# Patient Record
Sex: Female | Born: 1965
Health system: Southern US, Community
[De-identification: ages and names within clinical notes are randomized; demographics above are authoritative.]

## PROBLEM LIST (undated history)

## (undated) DIAGNOSIS — I10 Essential (primary) hypertension: Secondary | ICD-10-CM

## (undated) HISTORY — DX: Essential (primary) hypertension: I10

## (undated) HISTORY — PX: CHOLECYSTECTOMY: SHX55

---

## 1999-09-04 ENCOUNTER — Emergency Department (HOSPITAL_COMMUNITY): Admission: EM | Admit: 1999-09-04 | Discharge: 1999-09-04 | Payer: Self-pay | Admitting: Emergency Medicine

## 2000-07-12 ENCOUNTER — Other Ambulatory Visit: Admission: RE | Admit: 2000-07-12 | Discharge: 2000-07-12 | Payer: Self-pay | Admitting: Family Medicine

## 2001-10-11 ENCOUNTER — Other Ambulatory Visit: Admission: RE | Admit: 2001-10-11 | Discharge: 2001-10-11 | Payer: Self-pay | Admitting: Family Medicine

## 2003-04-09 ENCOUNTER — Other Ambulatory Visit: Admission: RE | Admit: 2003-04-09 | Discharge: 2003-04-09 | Payer: Self-pay | Admitting: Family Medicine

## 2004-05-23 ENCOUNTER — Other Ambulatory Visit: Admission: RE | Admit: 2004-05-23 | Discharge: 2004-05-23 | Payer: Self-pay | Admitting: Family Medicine

## 2005-06-28 ENCOUNTER — Other Ambulatory Visit: Admission: RE | Admit: 2005-06-28 | Discharge: 2005-06-28 | Payer: Self-pay | Admitting: Family Medicine

## 2006-01-24 ENCOUNTER — Emergency Department (HOSPITAL_COMMUNITY): Admission: EM | Admit: 2006-01-24 | Discharge: 2006-01-24 | Payer: Self-pay | Admitting: Emergency Medicine

## 2006-07-02 ENCOUNTER — Other Ambulatory Visit: Admission: RE | Admit: 2006-07-02 | Discharge: 2006-07-02 | Payer: Self-pay | Admitting: Family Medicine

## 2006-07-31 ENCOUNTER — Ambulatory Visit (HOSPITAL_COMMUNITY): Admission: RE | Admit: 2006-07-31 | Discharge: 2006-07-31 | Payer: Self-pay | Admitting: Obstetrics and Gynecology

## 2013-03-13 ENCOUNTER — Other Ambulatory Visit: Payer: Self-pay

## 2013-03-13 ENCOUNTER — Other Ambulatory Visit (HOSPITAL_COMMUNITY)
Admission: RE | Admit: 2013-03-13 | Discharge: 2013-03-13 | Disposition: A | Discharge status: Home / Self Care | Payer: BC Managed Care – PPO | Source: Ambulatory Visit | Attending: Family Medicine | Admitting: Family Medicine

## 2013-03-13 DIAGNOSIS — Z01419 Encounter for gynecological examination (general) (routine) without abnormal findings: Secondary | ICD-10-CM | POA: Insufficient documentation

## 2013-03-13 DIAGNOSIS — Z1151 Encounter for screening for human papillomavirus (HPV): Secondary | ICD-10-CM | POA: Insufficient documentation

## 2015-08-30 ENCOUNTER — Ambulatory Visit (INDEPENDENT_AMBULATORY_CARE_PROVIDER_SITE_OTHER): Payer: 59 | Admitting: Emergency Medicine

## 2015-08-30 VITALS — BP 158/90 | HR 62 | Temp 98.5°F | Resp 16 | Ht 64.17 in | Wt 227.4 lb

## 2015-08-30 DIAGNOSIS — R030 Elevated blood-pressure reading, without diagnosis of hypertension: Secondary | ICD-10-CM

## 2015-08-30 DIAGNOSIS — Z0000 Encounter for general adult medical examination without abnormal findings: Secondary | ICD-10-CM

## 2015-08-30 DIAGNOSIS — Z111 Encounter for screening for respiratory tuberculosis: Secondary | ICD-10-CM | POA: Diagnosis not present

## 2015-08-30 DIAGNOSIS — IMO0001 Reserved for inherently not codable concepts without codable children: Secondary | ICD-10-CM

## 2015-08-30 MED ORDER — TUBERCULIN PPD 5 UNIT/0.1ML ID SOLN
5.0000 [IU] | Freq: Once | INTRADERMAL | Status: AC
  Administered 2015-08-30: 5 [IU] via INTRADERMAL

## 2015-08-30 NOTE — Patient Instructions (Addendum)
IF you received an x-ray today, you will receive an invoice from [facility]. Please contact [facility] at [phone] with questions or concerns regarding your invoice.   IF you received labwork today, you will receive an invoice from [employer]. Please contact [facility] at [phone] with questions or concerns regarding your invoice.   Our billing staff will not be able to assist you with questions regarding bills from these companies.  You will be contacted with the lab results as soon as they are available. The fastest way to get your results is to activate your My Chart account. Instructions are located on the last page of this paperwork. If you have not heard from [place] regarding the results in 2 weeks, please contact this office.    please make an appointment to see her PCP regarding your elevated blood pressure. Please follow a DASH diet. Please return in 48-72 hours to have your PPD check  DASH Eating Plan DASH stands for "Dietary Approaches to Stop Hypertension." The DASH eating plan is a healthy eating plan that has been shown to reduce high blood pressure (hypertension). Additional health benefits may include reducing the risk of type 2 diabetes mellitus, heart disease, and stroke. The DASH eating plan may also help with weight loss. WHAT DO I NEED TO KNOW ABOUT THE DASH EATING PLAN? For the DASH eating plan, you will follow these general guidelines:  Choose foods with a percent daily value for sodium of less than 5% (as listed on the food label).  Use salt-free seasonings or herbs instead of table salt or sea salt.  Check with your health care provider or pharmacist before using salt substitutes.  Eat lower-sodium products, often labeled as "lower sodium" or "no salt added."  Eat fresh foods.  Eat more vegetables, fruits, and low-fat dairy products.  Choose whole grains. Look for the word "whole" as the first word in the  ingredient list.  Choose fish and skinless chicken or [place] more often than red meat. Limit fish, poultry, and meat to 6 oz (170 g) each day.  Limit sweets, desserts, sugars, and sugary drinks.  Choose heart-healthy fats.  Limit cheese to 1 oz (28 g) per day.  Eat more home-cooked food and less restaurant, buffet, and fast food.  Limit fried foods.  Cook foods using methods other than frying.  Limit canned vegetables. If you do use them, rinse them well to decrease the sodium.  When eating at a restaurant, ask that your food be prepared with less salt, or no salt if possible. WHAT FOODS CAN I EAT? Seek help from a dietitian for individual calorie needs. Grains Whole grain or whole wheat bread. Brown rice. Whole grain or whole wheat pasta. Quinoa, bulgur, and whole grain cereals. Low-sodium cereals. Corn or whole wheat flour tortillas. Whole grain cornbread. Whole grain crackers. Low-sodium crackers. Vegetables Fresh or frozen vegetables (raw, steamed, roasted, or grilled). Low-sodium or reduced-sodium tomato and vegetable juices. Low-sodium or reduced-sodium tomato sauce and paste. Low-sodium or reduced-sodium canned vegetables.  Fruits All fresh, canned (in natural juice), or frozen fruits. Meat and Other Protein Products Ground beef (85% or leaner), grass-fed beef, or beef trimmed of fat. Skinless chicken or [place]. Ground chicken or [place]. Pork trimmed of fat. All fish and seafood. Eggs. Dried beans, peas, or lentils. Unsalted nuts and seeds. Unsalted canned beans. Dairy Low-fat dairy products, such as skim or 1% milk, 2% or reduced-fat cheeses, low-fat ricotta or cottage cheese, or plain  low-fat yogurt. Low-sodium or reduced-sodium cheeses. Fats and Oils Tub margarines without trans fats. Light or reduced-fat mayonnaise and salad dressings (reduced sodium). Avocado. Safflower, olive, or canola oils. Natural peanut or almond butter. Other Unsalted popcorn and pretzels. The  items listed above may not be a complete list of recommended foods or beverages. Contact your dietitian for more options. WHAT FOODS ARE NOT RECOMMENDED? Grains White bread. White pasta. White rice. Refined cornbread. Bagels and croissants. Crackers that contain trans fat. Vegetables Creamed or fried vegetables. Vegetables in a cheese sauce. Regular canned vegetables. Regular canned tomato sauce and paste. Regular tomato and vegetable juices. Fruits Dried fruits. Canned fruit in light or heavy syrup. Fruit juice. Meat and Other Protein Products Fatty cuts of meat. Ribs, chicken wings, bacon, sausage, bologna, salami, chitterlings, fatback, hot dogs, bratwurst, and packaged luncheon meats. Salted nuts and seeds. Canned beans with salt. Dairy Whole or 2% milk, cream, half-and-half, and cream cheese. Whole-fat or sweetened yogurt. Full-fat cheeses or blue cheese. Nondairy creamers and whipped toppings. Processed cheese, cheese spreads, or cheese curds. Condiments Onion and garlic salt, seasoned salt, table salt, and sea salt. Canned and packaged gravies. Worcestershire sauce. Tartar sauce. Barbecue sauce. Teriyaki sauce. Soy sauce, including reduced sodium. Steak sauce. Fish sauce. Oyster sauce. Cocktail sauce. Horseradish. Ketchup and mustard. Meat flavorings and tenderizers. Bouillon cubes. Hot sauce. Tabasco sauce. Marinades. Taco seasonings. Relishes. Fats and Oils Butter, stick margarine, lard, shortening, ghee, and bacon fat. Coconut, palm kernel, or palm oils. Regular salad dressings. Other Pickles and olives. Salted popcorn and pretzels. The items listed above may not be a complete list of foods and beverages to avoid. Contact your dietitian for more information. WHERE CAN I FIND MORE INFORMATION? National Heart, Lung, and Blood Institute: travelstabloid.com   This information is not intended to replace advice given to you by your health care provider. Make  sure you discuss any questions you have with your health care provider.   Document Released: 05/11/2011 Document Revised: 06/12/2014 Document Reviewed: 03/26/2013 Elsevier Interactive Patient Education [employer].

## 2015-08-30 NOTE — Progress Notes (Signed)
Patient ID: Sabrina Spence, female   DOB: Apr 16, 1966, 50 y.o.   MRN: [id number]     By signing my name below, I, Sabrina Spence, attest that this documentation has been prepared under the direction and in the presence of Arlyss Queen, MD.  Electronically Signed: Zola Spence, Medical Scribe. 08/30/2015. 1:32 PM.   Chief Complaint:  Chief Complaint  Patient presents with  . Annual Exam    adoption form    HPI: Sabrina Spence is a 50 y.o. female who reports to [facility] today for an annual exam with adoption forms to be completed. She is required to have these forms filled because her mother is getting ready to adopt her third child, and everyone in the house is required to have a physical done with paperwork. Patient states she does not have any problems except for some arthralgias. She does not take any regular medications. She notes that her blood pressure sometimes fluctuates. Patient denies history of positive TB tests.  She normally has her annual physical exams with her PCP at [place] [facility]  History reviewed. No pertinent past medical history. Past Surgical History  Procedure Laterality Date  . Cesarean section    . Cholecystectomy     Social History   Social History  . Marital Status: Divorced    Spouse Name: N/A  . Number of Children: N/A  . Years of Education: N/A   Social History Main Topics  . Smoking status: Never Smoker   . Smokeless tobacco: None  . Alcohol Use: None  . Drug Use: None  . Sexual Activity: Not Asked   Other Topics Concern  . None   Social History Narrative  . None   No family history on file. No Known Allergies Prior to Admission medications   Not on File     ROS: The patient denies fevers, chills, night sweats, unintentional weight loss, chest pain, palpitations, wheezing, dyspnea on exertion, nausea, vomiting, abdominal pain, dysuria, hematuria, melena, numbness, weakness, or tingling.  All other systems have been reviewed and were  otherwise negative with the exception of those mentioned in the HPI and as above.    PHYSICAL EXAM: Filed Vitals:   08/30/15 1152  BP: 158/90  Pulse: 62  Temp: 98.5 F (36.9 C)  Resp: 16   Body mass index is 38.82 kg/(m^2).   General: Alert, no acute distress HEENT:  Normocephalic, atraumatic, oropharynx patent. Eye: Juliette Mangle [facility] Cardiovascular:  Regular rate and rhythm, no rubs murmurs or gallops.  No Carotid bruits, radial pulse intact. No pedal edema.  Respiratory: Clear to auscultation bilaterally.  No wheezes, rales, or rhonchi.  No cyanosis, no use of accessory musculature Abdominal: No organomegaly, abdomen is soft and non-tender, positive bowel sounds.  No masses. Musculoskeletal: Gait intact. No edema, tenderness Skin: No rashes. Neurologic: Facial musculature symmetric. Psychiatric: Patient acts appropriately throughout our interaction. Lymphatic: No cervical or submandibular lymphadenopathy    LABS:    EKG/XRAY:   Primary read interpreted by Dr. Everlene Farrier at [facility].   ASSESSMENT/PLAN: Patient has elevated blood pressure. I gave her information about DASH diet. She has no contraindications to being in a home where a child is being [facility] cared for  or adopted. PPD was applied today. She was advised to follow-up with her PCP for management of her elevated blood pressure.I personally performed the services described in this documentation, which was scribed in my presence. The recorded information has been reviewed and is accurate.    Gross sideeffects, risk and benefits,  and alternatives of medications d/w patient. Patient is aware that all medications have potential sideeffects and we are unable to predict every sideeffect or drug-drug interaction that may occur.  Arlyss Queen MD 08/30/2015 1:32 PM

## 2015-09-01 ENCOUNTER — Ambulatory Visit: Payer: 59 | Admitting: Emergency Medicine

## 2015-09-01 ENCOUNTER — Encounter: Payer: 59 | Admitting: Emergency Medicine

## 2015-09-01 DIAGNOSIS — Z111 Encounter for screening for respiratory tuberculosis: Secondary | ICD-10-CM

## 2015-09-01 NOTE — Addendum Note (Signed)
Addended by: Orion Crook on: 09/01/2015 04:50 PM   Modules accepted: Orders, SmartSet

## 2015-09-03 LAB — TB SKIN TEST
INDURATION: 0 mm
TB SKIN TEST: NEGATIVE

## 2015-09-03 NOTE — Addendum Note (Signed)
Addended by: Orion Crook on: 09/03/2015 08:23 PM   Modules accepted: Miquel Dunn

## 2015-12-20 NOTE — Progress Notes (Signed)
Presents for TB skin test reading.

## 2017-03-26 ENCOUNTER — Ambulatory Visit (INDEPENDENT_AMBULATORY_CARE_PROVIDER_SITE_OTHER): Payer: 59 | Admitting: Podiatry

## 2017-03-26 ENCOUNTER — Encounter: Payer: Self-pay | Admitting: Podiatry

## 2017-03-26 VITALS — BP 160/90 | HR 63 | Ht 64.0 in | Wt 220.0 lb

## 2017-03-26 DIAGNOSIS — M79671 Pain in right foot: Secondary | ICD-10-CM | POA: Insufficient documentation

## 2017-03-26 DIAGNOSIS — M79672 Pain in left foot: Secondary | ICD-10-CM | POA: Diagnosis not present

## 2017-03-26 DIAGNOSIS — M216X2 Other acquired deformities of left foot: Secondary | ICD-10-CM | POA: Diagnosis not present

## 2017-03-26 DIAGNOSIS — M216X1 Other acquired deformities of right foot: Secondary | ICD-10-CM | POA: Diagnosis not present

## 2017-03-26 DIAGNOSIS — M722 Plantar fascial fibromatosis: Secondary | ICD-10-CM

## 2017-03-26 DIAGNOSIS — M6702 Short Achilles tendon (acquired), left ankle: Secondary | ICD-10-CM

## 2017-03-26 DIAGNOSIS — M6701 Short Achilles tendon (acquired), right ankle: Secondary | ICD-10-CM

## 2017-03-26 NOTE — Patient Instructions (Addendum)
Seen for pain in both feet. Noted of flattened arch, raised first metatarsal bone, excess subtalar joint pronation, and tight Achilles tendon bilateral. Reviewed daily stretch exercise for tight tendon. Ankle brace dispensed x 2 with instruction. Need custom orthotics.  Will call with information.

## 2017-03-26 NOTE — Progress Notes (Signed)
SUBJECTIVE: 51 y.o. year old female presents complaining of severe pain on arch of both feet duration of over 6 month. On feet at work about 10 hours. Requires to wear steel toed boots. Denies any medical conditions being treated currently.  Review of Systems  Constitutional: Negative for chills, diaphoresis and fever.  HENT: Negative for ear discharge, ear pain, hearing loss and tinnitus.   Eyes: Negative for blurred vision, double vision, photophobia, pain and discharge.  Respiratory: Negative for cough, shortness of breath and wheezing.   Cardiovascular: Negative for chest pain, palpitations, orthopnea and claudication.  Gastrointestinal: Negative for abdominal pain, diarrhea, heartburn, nausea and vomiting.  Genitourinary: Negative for dysuria, frequency, hematuria and urgency.  Musculoskeletal: Positive for joint pain. Negative for back pain, myalgias and neck pain.  Skin: Negative for itching and rash.  Neurological: Negative.  Negative for weakness.  Psychiatric/Behavioral: Negative.      OBJECTIVE: DERMATOLOGIC EXAMINATION: No abnormal findings.   VASCULAR EXAMINATION OF LOWER LIMBS: All pedal pulses are palpable with normal pulsation.  Capillary Filling times within 3 seconds in all digits.  Positive for mild ankle edema bilateral. Temperature gradient from tibial crest to dorsum of foot is within normal bilateral.  NEUROLOGIC EXAMINATION OF THE LOWER LIMBS: All epicritic and tactile sensations grossly intact. Sharp and Dull discriminatory sensations at the plantar ball of hallux is intact bilateral.   MUSCULOSKELETAL EXAMINATION: Positive for tight Achilles tendon bilateral. Abducted forefoot bilateral. Excess STJ pronation bilateral. Severe first ray sagittal plane motion bilateral. Flattening medial longitudinal arch with pain upon weight bearing bilateral. Dysfunctional Posterior tibialis tendon bilateral.  Medially bulging TNJ  bilateral.  ASSESSMENT: Symptomatic flexible flat foot. Posterior tibialis dysfunction. STJ hyperpronation bilateral. Plantar fasciitis bilateral. Achilles tendon contracture bilateral. Deformed first ray bilateral.  PLAN: Reviewed clinical findings and available treatment options, NSAIA, injection, pain medication, proper shoe gear, orthotics, ankle support, change in activity, and  possible surgical options. Ankle brace dispensed with instruction x 2. Daily stretch exercise reviewed and instructed for tight Achilles tendon. Patient will return for custom orthotics.

## 2018-12-02 ENCOUNTER — Other Ambulatory Visit: Payer: Self-pay | Admitting: Family Medicine

## 2018-12-02 ENCOUNTER — Other Ambulatory Visit (HOSPITAL_COMMUNITY)
Admission: RE | Admit: 2018-12-02 | Discharge: 2018-12-02 | Disposition: A | Discharge status: Home / Self Care | Payer: 59 | Source: Ambulatory Visit | Attending: Family Medicine | Admitting: Family Medicine

## 2018-12-02 DIAGNOSIS — Z01411 Encounter for gynecological examination (general) (routine) with abnormal findings: Secondary | ICD-10-CM | POA: Insufficient documentation

## 2018-12-04 LAB — CYTOLOGY - PAP
Diagnosis: NEGATIVE
HPV: NOT DETECTED

## 2019-02-12 ENCOUNTER — Other Ambulatory Visit: Payer: Self-pay

## 2019-02-12 ENCOUNTER — Encounter (HOSPITAL_BASED_OUTPATIENT_CLINIC_OR_DEPARTMENT_OTHER): Payer: Self-pay | Admitting: *Deleted

## 2019-02-12 ENCOUNTER — Emergency Department (HOSPITAL_BASED_OUTPATIENT_CLINIC_OR_DEPARTMENT_OTHER)
Admission: EM | Admit: 2019-02-12 | Discharge: 2019-02-12 | Disposition: A | Discharge status: Home / Self Care | Payer: 59 | Attending: Emergency Medicine | Admitting: Emergency Medicine

## 2019-02-12 DIAGNOSIS — Z20828 Contact with and (suspected) exposure to other viral communicable diseases: Secondary | ICD-10-CM | POA: Insufficient documentation

## 2019-02-12 DIAGNOSIS — R52 Pain, unspecified: Secondary | ICD-10-CM

## 2019-02-12 DIAGNOSIS — M791 Myalgia, unspecified site: Secondary | ICD-10-CM | POA: Insufficient documentation

## 2019-02-12 DIAGNOSIS — R509 Fever, unspecified: Secondary | ICD-10-CM | POA: Insufficient documentation

## 2019-02-12 NOTE — ED Triage Notes (Signed)
C/o fever , body aches and chills x 1 week

## 2019-02-12 NOTE — ED Provider Notes (Signed)
[facility] EMERGENCY DEPARTMENT Provider Note   CSN: [id number] Arrival date & time: 02/12/19  1252     History   Chief Complaint Chief Complaint  Patient presents with  . Fever    HPI Sabrina Spence is a 53 y.o. female.     HPI   Sabrina Spence is a 53 y.o. female, with a history of cholecystectomy, presenting to the ED with body aches, subjective fever, and chills for the last week. She has been taking Tylenol and ibuprofen with relief while these medications are in effect.  She came to the ED today because her mother told her she "might need an antibiotic."  Patient denies contact with known or suspected COVID-19 patients.  Denies cough, shortness of breath, chest pain, abdominal pain, urinary symptoms, N/V/D, sore throat, nasal symptoms, sinus discomfort, or any other complaints.    History reviewed. No pertinent past medical history.  Patient Active Problem List   Diagnosis Date Noted  . Foot pain, bilateral 03/26/2017    Past Surgical History:  Procedure Laterality Date  . CESAREAN SECTION    . CHOLECYSTECTOMY       OB History   No obstetric history on file.      Home Medications    Prior to Admission medications   Medication Sig Start Date End Date Taking? Authorizing Provider  amLODipine (NORVASC) 10 MG tablet TK 1 T PO D 12/23/18   [provider]    Family History History reviewed. No pertinent family history.  Social History Social History   Tobacco Use  . Smoking status: Never Smoker  . Smokeless tobacco: Never Used  Substance Use Topics  . Alcohol use: Not on file  . Drug use: Not on file     Allergies   Patient has no known allergies.   Review of Systems Review of Systems  Constitutional: Positive for chills, fatigue and fever. Negative for diaphoresis.  HENT: Negative for congestion, ear pain, rhinorrhea, sinus pressure, sinus pain, sore throat, trouble swallowing and voice change.   Respiratory: Negative  for cough and shortness of breath.   Cardiovascular: Negative for chest pain.  Gastrointestinal: Negative for abdominal pain, diarrhea, nausea and vomiting.  Genitourinary: Negative for dysuria, flank pain, hematuria and vaginal discharge.  Musculoskeletal: Negative for back pain, neck pain and neck stiffness.  Skin: Negative for rash.  Neurological: Negative for dizziness, syncope, weakness, light-headedness and headaches.  All other systems reviewed and are negative.    Physical Exam Updated Vital Signs BP (!) 158/88   Pulse 99   Temp 98.5 F (36.9 C) (Oral)   Resp 18   Ht 5\' 4"  (1.626 m)   Wt 99.8 kg   LMP 02/11/2019   SpO2 98%   BMI 37.76 kg/m   Physical Exam Vitals signs and nursing note reviewed.  Constitutional:      General: She is not in acute distress.    Appearance: She is well-developed. She is not diaphoretic.  HENT:     Head: Normocephalic and atraumatic.     Nose: Nose normal.     Mouth/Throat:     Mouth: Mucous membranes are moist.     Pharynx: Oropharynx is clear.  Eyes:     Conjunctiva/sclera: Conjunctivae normal.  Neck:     Musculoskeletal: Neck supple.  Cardiovascular:     Rate and Rhythm: Normal rate and regular rhythm.     Pulses: Normal pulses.          Radial pulses are 2+ on  the right side and 2+ on the left side.       Posterior tibial pulses are 2+ on the right side and 2+ on the left side.     Heart sounds: Normal heart sounds.     Comments: Tactile temperature in the extremities appropriate and equal bilaterally. Pulmonary:     Effort: Pulmonary effort is normal. No respiratory distress.     Breath sounds: Normal breath sounds.  Abdominal:     Palpations: Abdomen is soft.     Tenderness: There is no abdominal tenderness. There is no guarding.  Musculoskeletal:     Right lower leg: No edema.     Left lower leg: No edema.  Lymphadenopathy:     Cervical: No cervical adenopathy.  Skin:    General: Skin is warm and dry.   Neurological:     Mental Status: She is alert.  Psychiatric:        Mood and Affect: Mood and affect normal.        Speech: Speech normal.        Behavior: Behavior normal.      ED Treatments / Results  Labs (all labs ordered are listed, but only abnormal results are displayed) Labs Reviewed  NOVEL CORONAVIRUS, NAA (HOSP ORDER, SEND-OUT TO REF LAB; TAT 18-24 HRS)    EKG None  Radiology No results found.  Procedures Procedures (including critical care time)  Medications Ordered in ED Medications - No data to display   Initial Impression / Assessment and Plan / ED Course  I have reviewed the triage vital signs and the nursing notes.  Pertinent labs & imaging results that were available during my care of the patient were reviewed by me and considered in my medical decision making (see chart for details).        Patient presents with body aches and subjective fever for the past week. Patient is nontoxic appearing, afebrile, not tachycardic, not tachypneic, not hypotensive, excellent SPO2 on room air, and is in no apparent distress.  The patient was given instructions for home care as well as return precautions. Patient voices understanding of these instructions, accepts the plan, and is comfortable with discharge.   Final Clinical Impressions(s) / ED Diagnoses   Final diagnoses:  Body aches  Fever and chills    ED Discharge Orders    None       Layla Maw 02/12/19 1413    Blanchie Dessert, MD 02/12/19 [phone]

## 2019-02-12 NOTE — Discharge Instructions (Signed)
Your symptoms are likely consistent with a viral illness. Viruses do not require or respond to antibiotics. Treatment is symptomatic care and it is important to note that these symptoms may last for 7-14 days.   Hand washing: Wash your hands throughout the day, but especially before and after touching the face, using the restroom, sneezing, coughing, or touching surfaces that have been coughed or sneezed upon. Hydration: Symptoms of most illnesses will be intensified and complicated by dehydration. Dehydration can also extend the duration of symptoms. Drink plenty of fluids and get plenty of rest. You should be drinking at least half a liter of water an hour to stay hydrated. Electrolyte drinks (ex. Gatorade, Powerade, Pedialyte) are also encouraged. You should be drinking enough fluids to make your urine light yellow, almost clear. If this is not the case, you are not drinking enough water. Please note that some of the treatments indicated below will not be effective if you are not adequately hydrated. Pain or fever: Ibuprofen, Naproxen, or acetaminophen (generic for Tylenol) for pain or fever.  Antiinflammatory medications: Take 600 mg of ibuprofen every 6 hours or 440 mg (over the counter dose) to 500 mg (prescription dose) of naproxen every 12 hours for the next 3 days. After this time, these medications may be used as needed for pain. Take these medications with food to avoid upset stomach. Choose only one of these medications, do not take them together. Acetaminophen (generic for Tylenol): Should you continue to have additional pain while taking the ibuprofen or naproxen, you may add in acetaminophen as needed. Your daily total maximum amount of acetaminophen from all sources should be limited to 4000mg /day for persons without liver problems, or 2000mg /day for those with liver problems. Zyrtec or Claritin: May add these medication daily to control underlying symptoms of congestion, sneezing, and other  signs of allergies.  These medications are available over-the-counter. Generics: Cetirizine (generic for Zyrtec) and loratadine (generic for Claritin). Fluticasone: Use fluticasone (generic for Flonase), as directed, for nasal and sinus congestion.  This medication is available over-the-counter. Congestion: Plain guaifenesin (generic for plain Mucinex) may help relieve congestion. Saline sinus rinses and saline nasal sprays may also help relieve congestion. If you do not have high blood pressure, heart problems, or an allergy to such medications, you may also try phenylephrine or Sudafed. Sore throat: Warm liquids or Chloraseptic spray may help soothe a sore throat. Gargle twice a day with a salt water solution made from a half teaspoon of salt in a cup of warm water.  Follow up: Follow up with a primary care provider within the next two weeks should symptoms fail to resolve. Return: Return to the ED for significantly worsening symptoms, shortness of breath, persistent vomiting, large amounts of blood in stool, or any other major concerns.  You have a test pending for COVID-19.  Results typically return within about 48 hours.  Be sure to check MyChart for updated results.  We recommend isolating yourself until results are received.  Patients who have symptoms consistent with COVID-19 should self isolated for: At least 3 days (72 hours) have passed since recovery, defined as resolution of fever without the use of fever reducing medications and improvement in respiratory symptoms (e.g., cough, shortness of breath), and At least 7 days have passed since symptoms first appeared.  If you have no symptoms, but your test returns positive, recommend isolating for at least 10 days.   For prescription assistance, may try using prescription discount sites or apps, such  as goodrx.com

## 2019-02-13 LAB — NOVEL CORONAVIRUS, NAA (HOSP ORDER, SEND-OUT TO REF LAB; TAT 18-24 HRS): SARS-CoV-2, NAA: NOT DETECTED

## 2019-02-19 ENCOUNTER — Other Ambulatory Visit: Payer: Self-pay | Admitting: Physician Assistant

## 2019-02-19 ENCOUNTER — Emergency Department (HOSPITAL_BASED_OUTPATIENT_CLINIC_OR_DEPARTMENT_OTHER): Payer: 59

## 2019-02-19 ENCOUNTER — Encounter (HOSPITAL_BASED_OUTPATIENT_CLINIC_OR_DEPARTMENT_OTHER): Payer: Self-pay

## 2019-02-19 ENCOUNTER — Other Ambulatory Visit: Payer: Self-pay

## 2019-02-19 ENCOUNTER — Emergency Department (HOSPITAL_BASED_OUTPATIENT_CLINIC_OR_DEPARTMENT_OTHER)
Admission: EM | Admit: 2019-02-19 | Discharge: 2019-02-19 | Disposition: A | Discharge status: Home / Self Care | Payer: 59 | Source: Home / Self Care | Attending: Emergency Medicine | Admitting: Emergency Medicine

## 2019-02-19 DIAGNOSIS — I10 Essential (primary) hypertension: Secondary | ICD-10-CM | POA: Insufficient documentation

## 2019-02-19 DIAGNOSIS — N83201 Unspecified ovarian cyst, right side: Secondary | ICD-10-CM

## 2019-02-19 DIAGNOSIS — R1031 Right lower quadrant pain: Secondary | ICD-10-CM | POA: Insufficient documentation

## 2019-02-19 DIAGNOSIS — Z79899 Other long term (current) drug therapy: Secondary | ICD-10-CM | POA: Insufficient documentation

## 2019-02-19 LAB — COMPREHENSIVE METABOLIC PANEL
ALT: 54 U/L — ABNORMAL HIGH (ref 0–44)
AST: 41 U/L (ref 15–41)
Albumin: 3.1 g/dL — ABNORMAL LOW (ref 3.5–5.0)
Alkaline Phosphatase: 234 U/L — ABNORMAL HIGH (ref 38–126)
Anion gap: 12 (ref 5–15)
BUN: 7 mg/dL (ref 6–20)
CO2: 25 mmol/L (ref 22–32)
Calcium: 9.1 mg/dL (ref 8.9–10.3)
Chloride: 98 mmol/L (ref 98–111)
Creatinine, Ser: 0.85 mg/dL (ref 0.44–1.00)
GFR calc Af Amer: >60 mL/min (ref >60)
GFR calc non Af Amer: >60 mL/min (ref >60)
Glucose, Bld: 143 mg/dL — ABNORMAL HIGH (ref 70–99)
Potassium: 3.7 mmol/L (ref 3.5–5.1)
Sodium: 135 mmol/L (ref 135–145)
Total Bilirubin: 1.2 mg/dL (ref 0.3–1.2)
Total Protein: 8.4 g/dL — ABNORMAL HIGH (ref 6.5–8.1)

## 2019-02-19 LAB — CBC
HCT: 31.7 % — ABNORMAL LOW (ref 36.0–46.0)
Hemoglobin: 10.3 g/dL — ABNORMAL LOW (ref 12.0–15.0)
MCH: 28.4 pg (ref 26.0–34.0)
MCHC: 32.5 g/dL (ref 30.0–36.0)
MCV: 87.3 fL (ref 80.0–100.0)
Platelets: 598 10*3/uL — ABNORMAL HIGH (ref 150–400)
RBC: 3.63 MIL/uL — ABNORMAL LOW (ref 3.87–5.11)
RDW: 14.8 % (ref 11.5–15.5)
WBC: 18 10*3/uL — ABNORMAL HIGH (ref 4.0–10.5)
nRBC: 0 % (ref 0.0–0.2)

## 2019-02-19 LAB — URINALYSIS, ROUTINE W REFLEX MICROSCOPIC
Bilirubin Urine: NEGATIVE
Glucose, UA: NEGATIVE mg/dL
Hgb urine dipstick: NEGATIVE
Ketones, ur: NEGATIVE mg/dL
Leukocytes,Ua: NEGATIVE
Nitrite: NEGATIVE
Protein, ur: NEGATIVE mg/dL
Specific Gravity, Urine: 1.015 (ref 1.005–1.030)
pH: 7 (ref 5.0–8.0)

## 2019-02-19 LAB — LACTIC ACID, PLASMA
Lactic Acid, Venous: 1.3 mmol/L (ref 0.5–1.9)
Lactic Acid, Venous: 1.2 mmol/L (ref 0.5–1.9)

## 2019-02-19 LAB — WET PREP, GENITAL
Sperm: NONE SEEN
Trich, Wet Prep: NONE SEEN
Yeast Wet Prep HPF POC: NONE SEEN

## 2019-02-19 LAB — PREGNANCY, URINE: Preg Test, Ur: NEGATIVE

## 2019-02-19 LAB — LIPASE, BLOOD: Lipase: 26 U/L (ref 11–51)

## 2019-02-19 MED ORDER — PIPERACILLIN-TAZOBACTAM 3.375 G IVPB
3.3750 g | Freq: Once | INTRAVENOUS | Status: AC
  Administered 2019-02-19: 15:00:00 3.375 g via INTRAVENOUS
  Filled 2019-02-19: qty 50

## 2019-02-19 MED ORDER — SODIUM CHLORIDE 0.9 % IV BOLUS
1000.0000 mL | Freq: Once | INTRAVENOUS | Status: AC
  Administered 2019-02-19: 14:00:00 1000 mL via INTRAVENOUS

## 2019-02-19 MED ORDER — AZITHROMYCIN 250 MG PO TABS
1000.0000 mg | ORAL_TABLET | Freq: Once | ORAL | Status: AC
  Administered 2019-02-19: 1000 mg via ORAL
  Filled 2019-02-19: qty 4

## 2019-02-19 MED ORDER — CEFTRIAXONE SODIUM 250 MG IJ SOLR
250.0000 mg | Freq: Once | INTRAMUSCULAR | Status: AC
  Administered 2019-02-19: 250 mg via INTRAMUSCULAR
  Filled 2019-02-19: qty 250

## 2019-02-19 MED ORDER — MORPHINE SULFATE (PF) 4 MG/ML IV SOLN
4.0000 mg | Freq: Once | INTRAVENOUS | Status: AC
  Administered 2019-02-19: 4 mg via INTRAVENOUS
  Filled 2019-02-19: qty 1

## 2019-02-19 MED ORDER — SODIUM CHLORIDE 0.9 % IV BOLUS
500.0000 mL | Freq: Once | INTRAVENOUS | Status: AC
  Administered 2019-02-19: 500 mL via INTRAVENOUS

## 2019-02-19 MED ORDER — ACETAMINOPHEN 325 MG PO TABS
650.0000 mg | ORAL_TABLET | Freq: Once | ORAL | Status: AC
  Administered 2019-02-19: 14:00:00 650 mg via ORAL
  Filled 2019-02-19: qty 2

## 2019-02-19 MED ORDER — ONDANSETRON HCL 4 MG/2ML IJ SOLN
4.0000 mg | Freq: Once | INTRAMUSCULAR | Status: AC
  Administered 2019-02-19: 18:00:00 4 mg via INTRAVENOUS
  Filled 2019-02-19: qty 2

## 2019-02-19 MED ORDER — METRONIDAZOLE 500 MG PO TABS: 500.0000 mg | ORAL_TABLET | Freq: Two times a day (BID) | ORAL | 0 refills | Status: AC

## 2019-02-19 MED ORDER — ONDANSETRON HCL 4 MG/2ML IJ SOLN
4.0000 mg | Freq: Once | INTRAMUSCULAR | Status: AC
  Administered 2019-02-19: 4 mg via INTRAVENOUS
  Filled 2019-02-19: qty 2

## 2019-02-19 MED ORDER — SODIUM CHLORIDE 0.9% FLUSH
3.0000 mL | Freq: Once | INTRAVENOUS | Status: AC
  Administered 2019-02-19: 3 mL via INTRAVENOUS
  Filled 2019-02-19: qty 3

## 2019-02-19 MED ORDER — IOHEXOL 300 MG/ML  SOLN
100.0000 mL | Freq: Once | INTRAMUSCULAR | Status: AC | PRN
  Administered 2019-02-19: 100 mL via INTRAVENOUS

## 2019-02-19 MED ORDER — DOXYCYCLINE HYCLATE 100 MG PO CAPS: 100.0000 mg | ORAL_CAPSULE | Freq: Two times a day (BID) | ORAL | 0 refills | Status: AC

## 2019-02-19 NOTE — ED Provider Notes (Signed)
[facility] Provider Note   CSN: [id number] Arrival date & time: 02/19/19  1236     History   Chief Complaint Chief Complaint  Patient presents with   Abdominal Pain    HPI Sabrina Spence is a 53 y.o. female with past medical history of hypertension presents emergency department today with chief complaint of abdominal pain.  Onset was acute, starting 8 hours prior to arrival.  Patient states she was getting ready for work when she had sudden onset of abdominal pain.  Pain is located in the left lower quadrant and radiates throughout abdomen.  She states the pain is sharp and throbbing.  Pain has varied in intensity since onset, states that it comes in waves.  She tried taking Gas-X with minimal symptom relief.  She is currently rating pain 10 of 10 in severity.  She reports associated nausea and anorexia.  Last p.o. intake was dinner last night.  Her last bowel movement was 4 days ago and was loose stool, denies any blood in stool.  She has not checked her temperature but states she has felt warm, unsure if she has had fever.  She is also reporting urinary frequency and dysuria over the last 2 days.  She admits to being sexually active with one female partner.  She reports always using condoms.  Admits to history of STI when she was much younger.  Denies any sick contacts.  Denies any known contact with anyone positive for COVID-19.  Patient denies suspicious food intake, chills, shortness of breath, chest pain, back pain, pelvic pain, vaginal discharge, hematuria, emesis.  Abdominal surgical history includes cholecystectomy. History provided by patient with additional history obtained from chart review.      History reviewed. No pertinent past medical history.  Patient Active Problem List   Diagnosis Date Noted   Foot pain, bilateral 03/26/2017    Past Surgical History:  Procedure Laterality Date   CESAREAN SECTION     CHOLECYSTECTOMY       OB  History   No obstetric history on file.      Home Medications    Prior to Admission medications   Medication Sig Start Date End Date Taking? Authorizing Provider  amLODipine (NORVASC) 10 MG tablet TK 1 T PO D 12/23/18   [provider]  doxycycline (VIBRAMYCIN) 100 MG capsule Take 1 capsule (100 mg total) by mouth 2 (two) times daily for 14 days. 02/19/19 03/05/19  Viki Carrera E, PA-C  metroNIDAZOLE (FLAGYL) 500 MG tablet Take 1 tablet (500 mg total) by mouth 2 (two) times daily for 14 days. 02/19/19 03/05/19  Angelisa Winthrop, Harley Hallmark, PA-C    Family History No family history on file.  Social History Social History   Tobacco Use   Smoking status: Never Smoker   Smokeless tobacco: Never Used  Substance Use Topics   Alcohol use: Never    Alcohol/week: 0.0 standard drinks    Frequency: Never   Drug use: Never     Allergies   Patient has no known allergies.   Review of Systems Review of Systems  Constitutional: Positive for appetite change and fever. Negative for chills.  HENT: Negative for congestion, ear discharge, ear pain, sinus pressure, sinus pain and sore throat.   Eyes: Negative for pain and redness.  Respiratory: Negative for cough and shortness of breath.   Cardiovascular: Negative for chest pain.  Gastrointestinal: Positive for abdominal pain and nausea. Negative for abdominal distention, blood in stool, constipation, diarrhea and  vomiting.  Genitourinary: Positive for dysuria and frequency. Negative for hematuria and vaginal discharge.  Musculoskeletal: Negative for back pain and neck pain.  Skin: Negative for wound.  Neurological: Negative for weakness, numbness and headaches.     Physical Exam Updated Vital Signs BP (!) 147/87 (BP Location: Right Arm)    Pulse (!) 110    Temp (!) 100.9 F (38.3 C)    Resp (!) 29    Ht 5\' 4"  (1.626 m)    Wt 98.9 kg    LMP 02/11/2019    SpO2 96%    BMI 37.42 kg/m   Physical Exam Vitals signs and nursing note  reviewed.  Constitutional:      General: She is not in acute distress.    Appearance: She is not ill-appearing.  HENT:     Head: Normocephalic and atraumatic.     Right Ear: Tympanic membrane and external ear normal.     Left Ear: Tympanic membrane and external ear normal.     Nose: Nose normal.     Mouth/Throat:     Mouth: Mucous membranes are moist.     Pharynx: Oropharynx is clear.  Eyes:     General: No scleral icterus.       Right eye: No discharge.        Left eye: No discharge.     Extraocular Movements: Extraocular movements intact.     Conjunctiva/sclera: Conjunctivae normal.     Pupils: Pupils are equal, round, and reactive to light.  Neck:     Musculoskeletal: Normal range of motion.     Vascular: No JVD.  Cardiovascular:     Rate and Rhythm: Regular rhythm. Tachycardia present.     Pulses: Normal pulses.          Radial pulses are 2+ on the right side and 2+ on the left side.     Heart sounds: Normal heart sounds.  Pulmonary:     Comments: Lungs clear to auscultation in all fields. Symmetric chest rise. No wheezing, rales, or rhonchi. Abdominal:     Tenderness: There is no right CVA tenderness or left CVA tenderness.     Comments: Abdomen is soft, non-distended.  Diffuse tenderness on exam, worse in left lower quadrant and umbilical area.  No guarding or rigidity.  No peritoneal signs.  Genitourinary:    Comments: Normal external genitalia. No pain with speculum insertion. Closed cervical os with normal appearance - no rash or lesions. No significant discharge noted from cervix or in vaginal vault.  Minimal bleeding in vaginal vault noted.  On bimanual examination no adnexal tenderness.  Positive cervical motion tenderness on left. Chaperone English as a second language teacher present during exam.  Musculoskeletal: Normal range of motion.  Skin:    General: Skin is warm and dry.     Capillary Refill: Capillary refill takes less than 2 seconds.  Neurological:     Mental Status: She is  oriented to person, place, and time.     GCS: GCS eye subscore is 4. GCS verbal subscore is 5. GCS motor subscore is 6.     Comments: Fluent speech, no facial droop.  Psychiatric:        Behavior: Behavior normal.      ED Treatments / Results  Labs (all labs ordered are listed, but only abnormal results are displayed) Labs Reviewed  WET PREP, GENITAL - Abnormal; Notable for the following components:      Result Value   Clue Cells Wet Prep HPF POC PRESENT (*)  WBC, Wet Prep HPF POC MANY (*)    All other components within normal limits  COMPREHENSIVE METABOLIC PANEL - Abnormal; Notable for the following components:   Glucose, Bld 143 (*)    Total Protein 8.4 (*)    Albumin 3.1 (*)    ALT 54 (*)    Alkaline Phosphatase 234 (*)    All other components within normal limits  CBC - Abnormal; Notable for the following components:   WBC 18.0 (*)    RBC 3.63 (*)    Hemoglobin 10.3 (*)    HCT 31.7 (*)    Platelets 598 (*)    All other components within normal limits  CULTURE, BLOOD (ROUTINE X 2)  CULTURE, BLOOD (ROUTINE X 2)  LIPASE, BLOOD  URINALYSIS, ROUTINE W REFLEX MICROSCOPIC  PREGNANCY, URINE  LACTIC ACID, PLASMA  LACTIC ACID, PLASMA  RPR  HIV ANTIBODY (ROUTINE TESTING W REFLEX)  RPR  HIV ANTIBODY (ROUTINE TESTING W REFLEX)  GC/CHLAMYDIA PROBE AMP ([place]) NOT AT [facility]    EKG None  Radiology US Transvaginal Non-ob  Result Date: 02/19/2019 CLINICAL DATA:  Abnormal CT today. Abdominal pain started this morning. EXAM: TRANSABDOMINAL AND TRANSVAGINAL ULTRASOUND OF PELVIS DOPPLER ULTRASOUND OF OVARIES TECHNIQUE: Both transabdominal and transvaginal ultrasound examinations of the pelvis were performed. Transabdominal technique was performed for global imaging of the pelvis including uterus, ovaries, adnexal regions, and pelvic cul-de-sac. It was necessary to proceed with endovaginal exam following the transabdominal exam to visualize the endometrium and adnexal regions.  Color and duplex Doppler ultrasound was utilized to evaluate blood flow to the ovaries. COMPARISON:  CT of the abdomen and pelvis on 02/19/2019 FINDINGS: Uterus Measurements: 11.0 x 5.5 x 7.4 centimeters = volume: 233.3 mL. Multiple fibroids are present. Anterior fibroid is 2.0 x 1.6 x 1.9 centimeters. Second fibroid is 0.8 x 0.7 x 0.8 centimeters. Endometrium Thickness: 10.0 millimeters.  No focal abnormality visualized. Right ovary Measurements: 6.1 x 5.4 x 5.5 centimeters = volume: 96.0 mL. A complex cystic multi-septated mass in the RIGHT ovary measures 3.9 x 3.4 x 5.0 centimeters. Mass has a thickened wall and irregular avascular septations. Left ovary Measurements: 3.4 x 3.6 x 3.6 centimeters = volume: 23.3 mL. Largest follicle is 2.7 centimeters. Pulsed Doppler evaluation of both ovaries demonstrates normal low-resistance arterial and venous waveforms. Other findings Small to moderate amount of free pelvic fluid. IMPRESSION: 1. Complex cystic mass in the RIGHT ovary with avascular septations measuring 3.9 centimeters. There is no evidence for ovarian torsion at this time. The appearance favors hemorrhagic cyst and follow-up is recommended. Recommend follow-up pelvic ultrasound in 6-12 weeks. 2. Uterine fibroids. 3. Normal appearance of the LEFT ovary. Electronically Signed   By: Nolon Nations M.D.   On: 02/19/2019 16:35   US Pelvis Complete  Result Date: 02/19/2019 CLINICAL DATA:  Abnormal CT today. Abdominal pain started this morning. EXAM: TRANSABDOMINAL AND TRANSVAGINAL ULTRASOUND OF PELVIS DOPPLER ULTRASOUND OF OVARIES TECHNIQUE: Both transabdominal and transvaginal ultrasound examinations of the pelvis were performed. Transabdominal technique was performed for global imaging of the pelvis including uterus, ovaries, adnexal regions, and pelvic cul-de-sac. It was necessary to proceed with endovaginal exam following the transabdominal exam to visualize the endometrium and adnexal regions. Color and  duplex Doppler ultrasound was utilized to evaluate blood flow to the ovaries. COMPARISON:  CT of the abdomen and pelvis on 02/19/2019 FINDINGS: Uterus Measurements: 11.0 x 5.5 x 7.4 centimeters = volume: 233.3 mL. Multiple fibroids are present. Anterior fibroid is 2.0 x 1.6  x 1.9 centimeters. Second fibroid is 0.8 x 0.7 x 0.8 centimeters. Endometrium Thickness: 10.0 millimeters.  No focal abnormality visualized. Right ovary Measurements: 6.1 x 5.4 x 5.5 centimeters = volume: 96.0 mL. A complex cystic multi-septated mass in the RIGHT ovary measures 3.9 x 3.4 x 5.0 centimeters. Mass has a thickened wall and irregular avascular septations. Left ovary Measurements: 3.4 x 3.6 x 3.6 centimeters = volume: 23.3 mL. Largest follicle is 2.7 centimeters. Pulsed Doppler evaluation of both ovaries demonstrates normal low-resistance arterial and venous waveforms. Other findings Small to moderate amount of free pelvic fluid. IMPRESSION: 1. Complex cystic mass in the RIGHT ovary with avascular septations measuring 3.9 centimeters. There is no evidence for ovarian torsion at this time. The appearance favors hemorrhagic cyst and follow-up is recommended. Recommend follow-up pelvic ultrasound in 6-12 weeks. 2. Uterine fibroids. 3. Normal appearance of the LEFT ovary. Electronically Signed   By: Nolon Nations M.D.   On: 02/19/2019 16:35   Ct Abdomen Pelvis W Contrast  Result Date: 02/19/2019 CLINICAL DATA:  Acute lower pelvic pain and cramping left worse than right radiating across the anterior abdomen beginning this morning. Decreased appetite 1 week. EXAM: CT ABDOMEN AND PELVIS WITH CONTRAST TECHNIQUE: Multidetector CT imaging of the abdomen and pelvis was performed using the standard protocol following bolus administration of intravenous contrast. CONTRAST:  133mL OMNIPAQUE IOHEXOL 300 MG/ML  SOLN COMPARISON:  None. FINDINGS: Lower chest: Lung bases are within normal. Small to moderate size hiatal hernia. Hepatobiliary:  Previous cholecystectomy. Liver and biliary tree are normal. Pancreas: Normal. Spleen: Normal. Adrenals/Urinary Tract: Adrenal glands are normal. Kidneys are normal in size without hydronephrosis or nephrolithiasis. Ureters and bladder are normal. Stomach/Bowel: Small to moderate size hiatal hernia. Small bowel is within normal. Appendix is within normal. Colon is decompressed from the appendix flexure distally and otherwise unremarkable. Vascular/Lymphatic: Normal vasculature. Few small periaortic and right iliac chain lymph nodes. Reproductive: Uterus demonstrates a subcentimeter calcification over the fundus as the uterus and left ovary are otherwise normal. Right ovary is slightly enlarged measuring 5.2 x 6.6 x 6.8 cm and demonstrates cystic change there is mild stranding of the fat adjacent the right ovary and a small amount of free fluid in the pelvis. These findings could be seen due to torsion versus PID/tubo-ovarian abscess. Other: None. Musculoskeletal: No acute findings. IMPRESSION: 1. Slightly enlarged right ovary with cystic change and stranding of the adjacent fat as well as mild amount of adjacent free fluid. Findings may represent an acute process such as ovarian torsion or infection such as PID/tubo-ovarian abscess. Recommend pelvic ultrasound for further evaluation. 2.  Small to moderate size hiatal hernia. Electronically Signed   By: Marin Olp M.D.   On: 02/19/2019 15:00   [place] Art/ven Flow Abd Pelv Doppler  Result Date: 02/19/2019 CLINICAL DATA:  Abnormal CT today. Abdominal pain started this morning. EXAM: TRANSABDOMINAL AND TRANSVAGINAL ULTRASOUND OF PELVIS DOPPLER ULTRASOUND OF OVARIES TECHNIQUE: Both transabdominal and transvaginal ultrasound examinations of the pelvis were performed. Transabdominal technique was performed for global imaging of the pelvis including uterus, ovaries, adnexal regions, and pelvic cul-de-sac. It was necessary to proceed with endovaginal exam following the  transabdominal exam to visualize the endometrium and adnexal regions. Color and duplex Doppler ultrasound was utilized to evaluate blood flow to the ovaries. COMPARISON:  CT of the abdomen and pelvis on 02/19/2019 FINDINGS: Uterus Measurements: 11.0 x 5.5 x 7.4 centimeters = volume: 233.3 mL. Multiple fibroids are present. Anterior fibroid is 2.0 x 1.6 x  1.9 centimeters. Second fibroid is 0.8 x 0.7 x 0.8 centimeters. Endometrium Thickness: 10.0 millimeters.  No focal abnormality visualized. Right ovary Measurements: 6.1 x 5.4 x 5.5 centimeters = volume: 96.0 mL. A complex cystic multi-septated mass in the RIGHT ovary measures 3.9 x 3.4 x 5.0 centimeters. Mass has a thickened wall and irregular avascular septations. Left ovary Measurements: 3.4 x 3.6 x 3.6 centimeters = volume: 23.3 mL. Largest follicle is 2.7 centimeters. Pulsed Doppler evaluation of both ovaries demonstrates normal low-resistance arterial and venous waveforms. Other findings Small to moderate amount of free pelvic fluid. IMPRESSION: 1. Complex cystic mass in the RIGHT ovary with avascular septations measuring 3.9 centimeters. There is no evidence for ovarian torsion at this time. The appearance favors hemorrhagic cyst and follow-up is recommended. Recommend follow-up pelvic ultrasound in 6-12 weeks. 2. Uterine fibroids. 3. Normal appearance of the LEFT ovary. Electronically Signed   By: Nolon Nations M.D.   On: 02/19/2019 16:35    Procedures Procedures (including critical care time)  Medications Ordered in ED Medications  sodium chloride flush (NS) 0.9 % injection 3 mL (3 mLs Intravenous Given 02/19/19 1319)  sodium chloride 0.9 % bolus 1,000 mL (0 mLs Intravenous Stopped 02/19/19 1654)  ondansetron (ZOFRAN) injection 4 mg (4 mg Intravenous Given 02/19/19 1419)  morphine 4 MG/ML injection 4 mg (4 mg Intravenous Given 02/19/19 1422)  acetaminophen (TYLENOL) tablet 650 mg (650 mg Oral Given 02/19/19 1417)  piperacillin-tazobactam (ZOSYN)  IVPB 3.375 g (0 g Intravenous Stopped 02/19/19 1653)  iohexol (OMNIPAQUE) 300 MG/ML solution 100 mL (100 mLs Intravenous Contrast Given 02/19/19 1432)  cefTRIAXone (ROCEPHIN) injection 250 mg (250 mg Intramuscular Given 02/19/19 1827)  azithromycin (ZITHROMAX) tablet 1,000 mg (1,000 mg Oral Given 02/19/19 1822)  ondansetron (ZOFRAN) injection 4 mg (4 mg Intravenous Given 02/19/19 1823)  sodium chloride 0.9 % bolus 500 mL (0 mLs Intravenous Stopped 02/19/19 1900)  morphine 4 MG/ML injection 4 mg (4 mg Intravenous Given 02/19/19 1825)     Initial Impression / Assessment and Plan / ED Course  I have reviewed the triage vital signs and the nursing notes.  Pertinent labs & imaging results that were available during my care of the patient were reviewed by me and considered in my medical decision making (see chart for details).  Patient presents to the ED with complaints of abdominal pain. Patient nontoxic appearing, resting comfortably on stretcher.  Her vitals in triage meet sirs criteria with tachycardia, tachypnea and fever.  Will initiate sepsis work-up and start zosyn for possible intra abdominal infection.  On exam patient diffuse abdominal tenderness, no peritoneal signs.  No CVA tenderness will evaluate with labs and CT A/P. Analgesics, anti-emetics, and fluids administered.   Labs show leukocytosis of 18, hemoglobin of 10.3 prior to compare.  No severe electrolyte derangements.  Lipase is within normal range.  UA is without signs of infection.  Pregnancy test is negative.  Lactic acid is within normal range.  Blood cultures drawn and are pending. Pelvic exam performed with chaperone, patient has minimal bleeding in vaginal vault and left cervical motion tenderness. CT A/P shows slightly enlarged right ovary with cystic change and stranding of the adjacent fat as well as mild amount of adjacent free fluid. Radiologist reports findings may represent an acute process such as ovarian torsion or infection  such as PID/tubo-ovarian abscess.  Pelvic ultrasound shows complex cystic mass in the right ovary suggestive of hemorrhagic cyst. Negative for ovarian torsion. Patient reports pain has improved with IV morphine.  Repeat abdominal exam is benign. She is tolerating PO intake. Heart rate and fever improved. Pt treated for gonorrhea and chlamydia. She is aware she will need to inform partner if results are positive. Given her initial septic/infectious presentation will treat for PID. The patient appears reasonably screened and/or stabilized for discharge and I doubt any other medical condition or other [facility] requiring further screening, evaluation, or treatment in the ED at this time prior to discharge. The patient is safe for discharge with strict return precautions discussed. Recommend close gyn follow up. Findings and plan of care discussed with supervising physician Dr. Johnney Killian.   Portions of this note were generated with [employer]. Dictation errors may occur despite best attempts at proofreading.     Final Clinical Impressions(s) / ED Diagnoses   Final diagnoses:  Hemorrhagic cyst of right ovary    ED Discharge Orders         Ordered    doxycycline (VIBRAMYCIN) 100 MG capsule  2 times daily     02/19/19 2019    metroNIDAZOLE (FLAGYL) 500 MG tablet  2 times daily     02/19/19 2019           Cherre Robins, PA-C 02/20/19 [id number]    Charlesetta Shanks, MD 02/20/19 [phone]

## 2019-02-19 NOTE — ED Notes (Signed)
ED Provider at bedside. 

## 2019-02-19 NOTE — ED Notes (Signed)
Pt in ultrasound at this time

## 2019-02-19 NOTE — ED Notes (Signed)
Pt returned from ultrasound

## 2019-02-19 NOTE — Discharge Instructions (Addendum)
You have been seen today for abdominal pain. Please read and follow all provided instructions. Return to the emergency room for worsening condition or new concerning symptoms, especially  fever, uncontrollable pain or vomiting   Your CT scan today shows you have a hemorrhagic cyst on your right ovary well as uterine fibroids. Recommend follow-up pelvic ultrasound in 6-12 weeks.  1. Medications:  -Prescription is been sent to your pharmacy for doxycycline.  This is an antibiotic.  Please take as prescribed. -Prescription also sent for metronidazole.  This is also an antibiotic.  You cannot drink alcohol while taking this medication as it will make you very sick and vomit.  Continue usual home medications Take medications as prescribed. Please review all of the medicines and only take them if you do not have an allergy to them.   2. Treatment: rest, drink plenty of fluids  3. Follow Up: Please follow up with your primary doctor in 2-5 days for discussion of your diagnoses and further evaluation after today's visit; Call today to arrange your follow up.  -Recommend you follow-up with a gynecologist.  I included information for the women's clinic in [place].  You can follow-up there or with any gynecologist.  It is also a possibility that you have an allergic reaction to any of the medicines that you have been prescribed - Everybody reacts differently to medications and while MOST people have no trouble with most medicines, you may have a reaction such as nausea, vomiting, rash, swelling, shortness of breath. If this is the case, please stop taking the medicine immediately and contact your physician.  ?

## 2019-02-19 NOTE — ED Triage Notes (Addendum)
C/o abd pain started this am-denies n/v/d-NAD-steady gait-states gas x "helped some"-pain from 10/10 to 8/10 after gas x

## 2019-02-19 NOTE — ED Notes (Signed)
Pt states lower pelvic achy/cramping pain that radiates across anterior abd that began early this am 8/10, states loss of appetite 1 wk, last meal last night/[employer] curly fries. Denies n/v/d, has not experienced this type of pain before. Has not checked temp. Denies being aware of being around anyone sick.

## 2019-02-20 ENCOUNTER — Encounter (HOSPITAL_COMMUNITY): Payer: Self-pay | Admitting: Emergency Medicine

## 2019-02-20 ENCOUNTER — Inpatient Hospital Stay (HOSPITAL_COMMUNITY)
Admission: EM | Admit: 2019-02-20 | Discharge: 2019-02-24 | DRG: 759 | Disposition: A | Discharge status: Home / Self Care | Payer: 59 | Attending: Obstetrics & Gynecology | Admitting: Obstetrics & Gynecology

## 2019-02-20 DIAGNOSIS — Z6837 Body mass index (BMI) 37.0-37.9, adult: Secondary | ICD-10-CM

## 2019-02-20 DIAGNOSIS — N730 Acute parametritis and pelvic cellulitis: Secondary | ICD-10-CM | POA: Diagnosis present

## 2019-02-20 DIAGNOSIS — R7989 Other specified abnormal findings of blood chemistry: Secondary | ICD-10-CM | POA: Diagnosis present

## 2019-02-20 DIAGNOSIS — Z20828 Contact with and (suspected) exposure to other viral communicable diseases: Secondary | ICD-10-CM | POA: Diagnosis present

## 2019-02-20 DIAGNOSIS — A419 Sepsis, unspecified organism: Secondary | ICD-10-CM

## 2019-02-20 DIAGNOSIS — E669 Obesity, unspecified: Secondary | ICD-10-CM | POA: Diagnosis present

## 2019-02-20 DIAGNOSIS — N7093 Salpingitis and oophoritis, unspecified: Secondary | ICD-10-CM | POA: Diagnosis not present

## 2019-02-20 LAB — CBC WITH DIFFERENTIAL/PLATELET
Abs Immature Granulocytes: 0.24 10*3/uL — ABNORMAL HIGH (ref 0.00–0.07)
Basophils Absolute: 0 10*3/uL (ref 0.0–0.1)
Basophils Relative: 0 %
Eosinophils Absolute: 0 10*3/uL (ref 0.0–0.5)
Eosinophils Relative: 0 %
HCT: 28.2 % — ABNORMAL LOW (ref 36.0–46.0)
Hemoglobin: 9.5 g/dL — ABNORMAL LOW (ref 12.0–15.0)
Immature Granulocytes: 1 %
Lymphocytes Relative: 7 %
Lymphs Abs: 1.7 10*3/uL (ref 0.7–4.0)
MCH: 29.3 pg (ref 26.0–34.0)
MCHC: 33.7 g/dL (ref 30.0–36.0)
MCV: 87 fL (ref 80.0–100.0)
Monocytes Absolute: 0.8 10*3/uL (ref 0.1–1.0)
Monocytes Relative: 3 %
Neutro Abs: 20.8 10*3/uL — ABNORMAL HIGH (ref 1.7–7.7)
Neutrophils Relative %: 89 %
Platelets: 586 10*3/uL — ABNORMAL HIGH (ref 150–400)
RBC: 3.24 MIL/uL — ABNORMAL LOW (ref 3.87–5.11)
RDW: 14.8 % (ref 11.5–15.5)
WBC: 23.6 10*3/uL — ABNORMAL HIGH (ref 4.0–10.5)
nRBC: 0 % (ref 0.0–0.2)

## 2019-02-20 LAB — URINALYSIS, ROUTINE W REFLEX MICROSCOPIC
Bacteria, UA: NONE SEEN
Bilirubin Urine: NEGATIVE
Glucose, UA: NEGATIVE mg/dL
Hgb urine dipstick: NEGATIVE
Ketones, ur: 20 mg/dL — AB
Leukocytes,Ua: NEGATIVE
Nitrite: NEGATIVE
Protein, ur: 100 mg/dL — AB
Specific Gravity, Urine: 1.027 (ref 1.005–1.030)
pH: 5 (ref 5.0–8.0)

## 2019-02-20 LAB — COMPREHENSIVE METABOLIC PANEL
ALT: 39 U/L (ref 0–44)
AST: 29 U/L (ref 15–41)
Albumin: 2.5 g/dL — ABNORMAL LOW (ref 3.5–5.0)
Alkaline Phosphatase: 187 U/L — ABNORMAL HIGH (ref 38–126)
Anion gap: 23 — ABNORMAL HIGH (ref 5–15)
BUN: 8 mg/dL (ref 6–20)
CO2: 22 mmol/L (ref 22–32)
Calcium: 8.7 mg/dL — ABNORMAL LOW (ref 8.9–10.3)
Chloride: 99 mmol/L (ref 98–111)
Creatinine, Ser: 1.15 mg/dL — ABNORMAL HIGH (ref 0.44–1.00)
GFR calc Af Amer: >60 mL/min (ref >60)
GFR calc non Af Amer: 55 mL/min — ABNORMAL LOW (ref >60)
Glucose, Bld: 132 mg/dL — ABNORMAL HIGH (ref 70–99)
Potassium: 3.8 mmol/L (ref 3.5–5.1)
Sodium: 134 mmol/L — ABNORMAL LOW (ref 135–145)
Total Bilirubin: 2.2 mg/dL — ABNORMAL HIGH (ref 0.3–1.2)
Total Protein: 7.4 g/dL (ref 6.5–8.1)

## 2019-02-20 LAB — LACTIC ACID, PLASMA: Lactic Acid, Venous: 1 mmol/L (ref 0.5–1.9)

## 2019-02-20 LAB — I-STAT BETA HCG BLOOD, ED (MC, WL, AP ONLY): I-stat hCG, quantitative: 6.4 m[IU]/mL — ABNORMAL HIGH (ref <5)

## 2019-02-20 LAB — SARS CORONAVIRUS 2 BY RT PCR (HOSPITAL ORDER, PERFORMED IN ~~LOC~~ HOSPITAL LAB): SARS Coronavirus 2: NEGATIVE

## 2019-02-20 MED ORDER — PRENATAL MULTIVITAMIN CH
1.0000 | ORAL_TABLET | Freq: Every day | ORAL | Status: DC
  Administered 2019-02-21 – 2019-02-24 (×2): 1 via ORAL
  Filled 2019-02-20 (×4): qty 1

## 2019-02-20 MED ORDER — ONDANSETRON HCL 4 MG/2ML IJ SOLN
4.0000 mg | Freq: Four times a day (QID) | INTRAMUSCULAR | Status: DC | PRN
  Administered 2019-02-22 – 2019-02-23 (×3): 4 mg via INTRAVENOUS
  Filled 2019-02-20 (×3): qty 2

## 2019-02-20 MED ORDER — DOXYCYCLINE HYCLATE 100 MG PO TABS
100.0000 mg | ORAL_TABLET | Freq: Two times a day (BID) | ORAL | Status: DC
  Administered 2019-02-20 – 2019-02-24 (×8): 100 mg via ORAL
  Filled 2019-02-20 (×9): qty 1

## 2019-02-20 MED ORDER — OXYCODONE-ACETAMINOPHEN 5-325 MG PO TABS
1.0000 | ORAL_TABLET | ORAL | Status: DC | PRN
  Administered 2019-02-21 (×2): 1 via ORAL
  Administered 2019-02-22 (×2): 2 via ORAL
  Filled 2019-02-20 (×2): qty 2
  Filled 2019-02-20: qty 1
  Filled 2019-02-20 (×2): qty 2

## 2019-02-20 MED ORDER — LACTATED RINGERS IV SOLN
INTRAVENOUS | Status: DC
  Administered 2019-02-20 – 2019-02-24 (×6): via INTRAVENOUS

## 2019-02-20 MED ORDER — ONDANSETRON HCL 4 MG PO TABS: 4.0000 mg | ORAL_TABLET | Freq: Four times a day (QID) | ORAL | Status: DC | PRN

## 2019-02-20 MED ORDER — MORPHINE SULFATE (PF) 4 MG/ML IV SOLN
4.0000 mg | Freq: Once | INTRAVENOUS | Status: AC
  Administered 2019-02-20: 4 mg via INTRAVENOUS
  Filled 2019-02-20: qty 1

## 2019-02-20 MED ORDER — SODIUM CHLORIDE 0.9 % IV SOLN
2.0000 g | Freq: Two times a day (BID) | INTRAVENOUS | Status: DC
  Administered 2019-02-20 – 2019-02-24 (×8): 2 g via INTRAVENOUS
  Filled 2019-02-20 (×14): qty 2

## 2019-02-20 MED ORDER — PIPERACILLIN-TAZOBACTAM 3.375 G IVPB 30 MIN: 3.3750 g | Freq: Once | INTRAVENOUS | Status: DC

## 2019-02-20 MED ORDER — SODIUM CHLORIDE 0.9% FLUSH: 3.0000 mL | Freq: Once | INTRAVENOUS | Status: DC

## 2019-02-20 MED ORDER — ZOLPIDEM TARTRATE 5 MG PO TABS
5.0000 mg | ORAL_TABLET | Freq: Every evening | ORAL | Status: DC | PRN
  Administered 2019-02-23: 22:00:00 5 mg via ORAL
  Filled 2019-02-20: qty 1

## 2019-02-20 MED ORDER — SODIUM CHLORIDE 0.9 % IV BOLUS
1000.0000 mL | Freq: Once | INTRAVENOUS | Status: AC
  Administered 2019-02-20: 18:00:00 1000 mL via INTRAVENOUS

## 2019-02-20 MED ORDER — ACETAMINOPHEN 500 MG PO TABS
1000.0000 mg | ORAL_TABLET | Freq: Once | ORAL | Status: AC
  Administered 2019-02-20: 1000 mg via ORAL
  Filled 2019-02-20: qty 2

## 2019-02-20 MED ORDER — HYDROMORPHONE HCL 1 MG/ML IJ SOLN
0.2000 mg | INTRAMUSCULAR | Status: DC | PRN
  Administered 2019-02-20 – 2019-02-23 (×3): 0.5 mg via INTRAVENOUS
  Filled 2019-02-20 (×3): qty 1

## 2019-02-20 NOTE — ED Triage Notes (Signed)
Pt states she had a vaginal ultrasound yesterday- diagnosed with fibroids, cyst on her ovary. Pt is here for the pain to be treated. On assessment, pt has fever and is tachy. Pt states these symptoms started yesterday.

## 2019-02-20 NOTE — ED Notes (Signed)
Pt placed on 2L  for o2 sats of 89-91% on RA while sleeping. Will continue to monitor.

## 2019-02-20 NOTE — ED Notes (Signed)
ED TO INPATIENT HANDOFF REPORT  ED Nurse Name and Phone #:  [phone] [name] Name/Age/Gender Sabrina Spence 53 y.o. female Room/Bed: 036C/036C  Code Status   Code Status: Not on file  Home/SNF/Other Home Patient oriented to: self, place, time and situation Is this baseline? Yes   Triage Complete: Triage complete  Chief Complaint ABD PAIN  Triage Note Pt states she had a vaginal ultrasound yesterday- diagnosed with fibroids, cyst on her ovary. Pt is here for the pain to be treated. On assessment, pt has fever and is tachy. Pt states these symptoms started yesterday.   Allergies No Known Allergies  Level of Care/Admitting Diagnosis ED Disposition    ED Disposition Condition Comment   Admit  The patient appears reasonably stabilized for admission considering the current resources, flow, and capabilities available in the ED at this time, and I doubt any other [facility] requiring further screening and/or treatment in the ED prior to admission is  present.       B Medical/Surgery History History reviewed. No pertinent past medical history. Past Surgical History:  Procedure Laterality Date  . CESAREAN SECTION    . CHOLECYSTECTOMY       A IV Location/Drains/Wounds Patient Lines/Drains/Airways Status   Active Line/Drains/Airways    Name:   Placement date:   Placement time:   Site:   Days:   Peripheral IV 02/20/19 Right Antecubital   02/20/19    1724    Antecubital   less than 1          Intake/Output Last 24 hours No intake or output data in the 24 hours ending 02/20/19 1812  Labs/Imaging Results for orders placed or performed during the hospital encounter of 02/20/19 (from the past 48 hour(s))  Lactic acid, plasma     Status: None   Collection Time: 02/20/19 11:18 AM  Result Value Ref Range   Lactic Acid, Venous 1.0 0.5 - 1.9 mmol/L    Comment: Performed at [facility], [address], [place], [place] [address]  Comprehensive metabolic panel     Status:  Abnormal   Collection Time: 02/20/19 11:18 AM  Result Value Ref Range   Sodium 134 (L) 135 - 145 mmol/L   Potassium 3.8 3.5 - 5.1 mmol/L   Chloride 99 98 - 111 mmol/L   CO2 22 22 - 32 mmol/L   Glucose, Bld 132 (H) 70 - 99 mg/dL   BUN 8 6 - 20 mg/dL   Creatinine, Ser 1.15 (H) 0.44 - 1.00 mg/dL   Calcium 8.7 (L) 8.9 - 10.3 mg/dL   Total Protein 7.4 6.5 - 8.1 g/dL   Albumin 2.5 (L) 3.5 - 5.0 g/dL   AST 29 15 - 41 U/L   ALT 39 0 - 44 U/L   Alkaline Phosphatase 187 (H) 38 - 126 U/L   Total Bilirubin 2.2 (H) 0.3 - 1.2 mg/dL   GFR calc non Af Amer 55 (L) >60 mL/min   GFR calc Af Amer >60 >60 mL/min   Anion gap 23 (H) 5 - 15    Comment: Performed at [facility], [place] [address], [place], [place] [address]  CBC with Differential     Status: Abnormal   Collection Time: 02/20/19 11:18 AM  Result Value Ref Range   WBC 23.6 (H) 4.0 - 10.5 K/uL   RBC 3.24 (L) 3.87 - 5.11 MIL/uL   Hemoglobin 9.5 (L) 12.0 - 15.0 g/dL   HCT 28.2 (L) 36.0 - 46.0 %  MCV 87.0 80.0 - 100.0 fL   MCH 29.3 26.0 - 34.0 pg   MCHC 33.7 30.0 - 36.0 g/dL   RDW 14.8 11.5 - 15.5 %   Platelets 586 (H) 150 - 400 K/uL   nRBC 0.0 0.0 - 0.2 %   Neutrophils Relative % 89 %   Neutro Abs 20.8 (H) 1.7 - 7.7 K/uL   Lymphocytes Relative 7 %   Lymphs Abs 1.7 0.7 - 4.0 K/uL   Monocytes Relative 3 %   Monocytes Absolute 0.8 0.1 - 1.0 K/uL   Eosinophils Relative 0 %   Eosinophils Absolute 0.0 0.0 - 0.5 K/uL   Basophils Relative 0 %   Basophils Absolute 0.0 0.0 - 0.1 K/uL   Immature Granulocytes 1 %   Abs Immature Granulocytes 0.24 (H) 0.00 - 0.07 K/uL    Comment: Performed at [facility] [address], [place], [place] [address]  I-Stat beta hCG blood, ED     Status: Abnormal   Collection Time: 02/20/19 11:43 AM  Result Value Ref Range   I-stat hCG, quantitative 6.4 (H) <5 mIU/mL   Comment 3            Comment:   GEST. AGE      CONC.  (mIU/mL)   <=1 WEEK        5 - 50     2 WEEKS       50 - 500     3 WEEKS        100 - 10,000     4 WEEKS     1,000 - 30,000        FEMALE AND NON-PREGNANT FEMALE:     LESS THAN 5 mIU/mL   Urinalysis, Routine w reflex microscopic     Status: Abnormal   Collection Time: 02/20/19  3:40 PM  Result Value Ref Range   Color, Urine AMBER (A) YELLOW    Comment: BIOCHEMICALS MAY BE AFFECTED BY COLOR   APPearance HAZY (A) CLEAR   Specific Gravity, Urine 1.027 1.005 - 1.030   pH 5.0 5.0 - 8.0   Glucose, UA NEGATIVE NEGATIVE mg/dL   Hgb urine dipstick NEGATIVE NEGATIVE   Bilirubin Urine NEGATIVE NEGATIVE   Ketones, ur 20 (A) NEGATIVE mg/dL   Protein, ur 100 (A) NEGATIVE mg/dL   Nitrite NEGATIVE NEGATIVE   Leukocytes,Ua NEGATIVE NEGATIVE   RBC / HPF 0-5 0 - 5 RBC/hpf   WBC, UA 0-5 0 - 5 WBC/hpf   Bacteria, UA NONE SEEN NONE SEEN   Squamous Epithelial / LPF 0-5 0 - 5   Mucus PRESENT    Hyaline Casts, UA PRESENT     Comment: Performed at [facility], [address], [place], [place] [address]   US Transvaginal Non-ob  Result Date: 02/19/2019 CLINICAL DATA:  Abnormal CT today. Abdominal pain started this morning. EXAM: TRANSABDOMINAL AND TRANSVAGINAL ULTRASOUND OF PELVIS DOPPLER ULTRASOUND OF OVARIES TECHNIQUE: Both transabdominal and transvaginal ultrasound examinations of the pelvis were performed. Transabdominal technique was performed for global imaging of the pelvis including uterus, ovaries, adnexal regions, and pelvic cul-de-sac. It was necessary to proceed with endovaginal exam following the transabdominal exam to visualize the endometrium and adnexal regions. Color and duplex Doppler ultrasound was utilized to evaluate blood flow to the ovaries. COMPARISON:  CT of the abdomen and pelvis on 02/19/2019 FINDINGS: Uterus Measurements: 11.0 x 5.5 x 7.4 centimeters = volume: 233.3 mL. Multiple fibroids are present. Anterior fibroid is 2.0 x 1.6  x 1.9 centimeters. Second fibroid is 0.8 x 0.7 x 0.8 centimeters. Endometrium Thickness: 10.0 millimeters.  No focal  abnormality visualized. Right ovary Measurements: 6.1 x 5.4 x 5.5 centimeters = volume: 96.0 mL. A complex cystic multi-septated mass in the RIGHT ovary measures 3.9 x 3.4 x 5.0 centimeters. Mass has a thickened wall and irregular avascular septations. Left ovary Measurements: 3.4 x 3.6 x 3.6 centimeters = volume: 23.3 mL. Largest follicle is 2.7 centimeters. Pulsed Doppler evaluation of both ovaries demonstrates normal low-resistance arterial and venous waveforms. Other findings Small to moderate amount of free pelvic fluid. IMPRESSION: 1. Complex cystic mass in the RIGHT ovary with avascular septations measuring 3.9 centimeters. There is no evidence for ovarian torsion at this time. The appearance favors hemorrhagic cyst and follow-up is recommended. Recommend follow-up pelvic ultrasound in 6-12 weeks. 2. Uterine fibroids. 3. Normal appearance of the LEFT ovary. Electronically Signed   By: Nolon Nations M.D.   On: 02/19/2019 16:35   US Pelvis Complete  Result Date: 02/19/2019 CLINICAL DATA:  Abnormal CT today. Abdominal pain started this morning. EXAM: TRANSABDOMINAL AND TRANSVAGINAL ULTRASOUND OF PELVIS DOPPLER ULTRASOUND OF OVARIES TECHNIQUE: Both transabdominal and transvaginal ultrasound examinations of the pelvis were performed. Transabdominal technique was performed for global imaging of the pelvis including uterus, ovaries, adnexal regions, and pelvic cul-de-sac. It was necessary to proceed with endovaginal exam following the transabdominal exam to visualize the endometrium and adnexal regions. Color and duplex Doppler ultrasound was utilized to evaluate blood flow to the ovaries. COMPARISON:  CT of the abdomen and pelvis on 02/19/2019 FINDINGS: Uterus Measurements: 11.0 x 5.5 x 7.4 centimeters = volume: 233.3 mL. Multiple fibroids are present. Anterior fibroid is 2.0 x 1.6 x 1.9 centimeters. Second fibroid is 0.8 x 0.7 x 0.8 centimeters. Endometrium Thickness: 10.0 millimeters.  No focal abnormality  visualized. Right ovary Measurements: 6.1 x 5.4 x 5.5 centimeters = volume: 96.0 mL. A complex cystic multi-septated mass in the RIGHT ovary measures 3.9 x 3.4 x 5.0 centimeters. Mass has a thickened wall and irregular avascular septations. Left ovary Measurements: 3.4 x 3.6 x 3.6 centimeters = volume: 23.3 mL. Largest follicle is 2.7 centimeters. Pulsed Doppler evaluation of both ovaries demonstrates normal low-resistance arterial and venous waveforms. Other findings Small to moderate amount of free pelvic fluid. IMPRESSION: 1. Complex cystic mass in the RIGHT ovary with avascular septations measuring 3.9 centimeters. There is no evidence for ovarian torsion at this time. The appearance favors hemorrhagic cyst and follow-up is recommended. Recommend follow-up pelvic ultrasound in 6-12 weeks. 2. Uterine fibroids. 3. Normal appearance of the LEFT ovary. Electronically Signed   By: Nolon Nations M.D.   On: 02/19/2019 16:35   Ct Abdomen Pelvis W Contrast  Result Date: 02/19/2019 CLINICAL DATA:  Acute lower pelvic pain and cramping left worse than right radiating across the anterior abdomen beginning this morning. Decreased appetite 1 week. EXAM: CT ABDOMEN AND PELVIS WITH CONTRAST TECHNIQUE: Multidetector CT imaging of the abdomen and pelvis was performed using the standard protocol following bolus administration of intravenous contrast. CONTRAST:  155mL OMNIPAQUE IOHEXOL 300 MG/ML  SOLN COMPARISON:  None. FINDINGS: Lower chest: Lung bases are within normal. Small to moderate size hiatal hernia. Hepatobiliary: Previous cholecystectomy. Liver and biliary tree are normal. Pancreas: Normal. Spleen: Normal. Adrenals/Urinary Tract: Adrenal glands are normal. Kidneys are normal in size without hydronephrosis or nephrolithiasis. Ureters and bladder are normal. Stomach/Bowel: Small to moderate size hiatal hernia. Small bowel is within normal. Appendix is within normal. Colon is  decompressed from the appendix flexure  distally and otherwise unremarkable. Vascular/Lymphatic: Normal vasculature. Few small periaortic and right iliac chain lymph nodes. Reproductive: Uterus demonstrates a subcentimeter calcification over the fundus as the uterus and left ovary are otherwise normal. Right ovary is slightly enlarged measuring 5.2 x 6.6 x 6.8 cm and demonstrates cystic change there is mild stranding of the fat adjacent the right ovary and a small amount of free fluid in the pelvis. These findings could be seen due to torsion versus PID/tubo-ovarian abscess. Other: None. Musculoskeletal: No acute findings. IMPRESSION: 1. Slightly enlarged right ovary with cystic change and stranding of the adjacent fat as well as mild amount of adjacent free fluid. Findings may represent an acute process such as ovarian torsion or infection such as PID/tubo-ovarian abscess. Recommend pelvic ultrasound for further evaluation. 2.  Small to moderate size hiatal hernia. Electronically Signed   By: Marin Olp M.D.   On: 02/19/2019 15:00   [place] Art/ven Flow Abd Pelv Doppler  Result Date: 02/19/2019 CLINICAL DATA:  Abnormal CT today. Abdominal pain started this morning. EXAM: TRANSABDOMINAL AND TRANSVAGINAL ULTRASOUND OF PELVIS DOPPLER ULTRASOUND OF OVARIES TECHNIQUE: Both transabdominal and transvaginal ultrasound examinations of the pelvis were performed. Transabdominal technique was performed for global imaging of the pelvis including uterus, ovaries, adnexal regions, and pelvic cul-de-sac. It was necessary to proceed with endovaginal exam following the transabdominal exam to visualize the endometrium and adnexal regions. Color and duplex Doppler ultrasound was utilized to evaluate blood flow to the ovaries. COMPARISON:  CT of the abdomen and pelvis on 02/19/2019 FINDINGS: Uterus Measurements: 11.0 x 5.5 x 7.4 centimeters = volume: 233.3 mL. Multiple fibroids are present. Anterior fibroid is 2.0 x 1.6 x 1.9 centimeters. Second fibroid is 0.8 x 0.7 x 0.8  centimeters. Endometrium Thickness: 10.0 millimeters.  No focal abnormality visualized. Right ovary Measurements: 6.1 x 5.4 x 5.5 centimeters = volume: 96.0 mL. A complex cystic multi-septated mass in the RIGHT ovary measures 3.9 x 3.4 x 5.0 centimeters. Mass has a thickened wall and irregular avascular septations. Left ovary Measurements: 3.4 x 3.6 x 3.6 centimeters = volume: 23.3 mL. Largest follicle is 2.7 centimeters. Pulsed Doppler evaluation of both ovaries demonstrates normal low-resistance arterial and venous waveforms. Other findings Small to moderate amount of free pelvic fluid. IMPRESSION: 1. Complex cystic mass in the RIGHT ovary with avascular septations measuring 3.9 centimeters. There is no evidence for ovarian torsion at this time. The appearance favors hemorrhagic cyst and follow-up is recommended. Recommend follow-up pelvic ultrasound in 6-12 weeks. 2. Uterine fibroids. 3. Normal appearance of the LEFT ovary. Electronically Signed   By: Nolon Nations M.D.   On: 02/19/2019 16:35    Pending Labs Unresulted Labs (From admission, onward)    Start     Ordered   02/20/19 1646  SARS Coronavirus 2 [facility] order, Performed in [facility] hospital lab) Nasopharyngeal Nasopharyngeal Swab  (Symptomatic/High Risk of Exposure/Tier 1 Patients Labs with Precautions)  Once,   STAT    Question Answer Comment  Is this test for diagnosis or screening Diagnosis of ill patient   Symptomatic for COVID-19 as defined by CDC Yes   Date of Symptom Onset 02/17/2019   Hospitalized for COVID-19 No   Admitted to ICU for COVID-19 No   Previously tested for COVID-19 Yes   Resident in a congregate (group) care setting No   Employed in healthcare setting No   Pregnant No      02/20/19 1646   02/20/19 1116  Lactic acid, plasma  Now then every 2 hours,   STAT     02/20/19 1115   Signed and Held  Basic metabolic panel  Tomorrow morning,   R     Signed and Held   Signed and Held  CBC  Tomorrow morning,   R      Signed and Held   Signed and Held  hCG, quantitative, pregnancy  Tomorrow morning,   R     Signed and Held          Vitals/Pain Today's Vitals   02/20/19 1702 02/20/19 1730 02/20/19 1800 02/20/19 1806  BP:  131/84 139/86   Pulse:  (!) 113 (!) 121   Resp:  (!) 29 (!) 31   Temp:      TempSrc:      SpO2:  93% 95%   Weight:      Height:      PainSc: 10-Worst pain ever   10-Worst pain ever    Isolation Precautions No active isolations  Medications Medications  sodium chloride flush (NS) 0.9 % injection 3 mL (has no administration in time range)  acetaminophen (TYLENOL) tablet 1,000 mg (1,000 mg Oral Given 02/20/19 1708)  sodium chloride 0.9 % bolus 1,000 mL (1,000 mLs Intravenous New Bag/Given 02/20/19 1730)  morphine 4 MG/ML injection 4 mg (4 mg Intravenous Given 02/20/19 1807)    Mobility walks Low fall risk   Focused Assessments    R Recommendations: See Admitting Provider Note  Report given to:   Additional Notes:

## 2019-02-20 NOTE — H&P (Signed)
Sabrina Spence is an 53 y.o. female. G1P1001, Patient's last menstrual period was 02/11/2019. Sabrina Spence is a 53 y.o. female with a past medical who treated cholecystectomy presents today for evaluation of fevers, abdominal pain and generally feeling unwell.  She was seen yesterday at [facility] and there was concern for PID as she had cervical motion tenderness on exam.  Chart review shows that she was discharged with antibiotics, and given azithromycin and Rocephin.  She reports that her pain has continued to worsen and she has poor appetite.  Review shows that she had CT abdomen pelvis obtained showing an enlarged right ovary with cystic change concerning for ovarian torsion or PID/TOA.  Pelvic ultrasound showed a complex cystic mass on the right ovary with avascular septations concerning for a hemorrhagic cyst.    Patient reports that she is noting blood in her urine.  She reports urinary frequency since last night.   She reports a slight headache however denies recent coronavirus contacts or cough.  No shortness of breath.  She reports that she has had 3-4 episodes of diarrhea since last night.       Pertinent Gynecological History: Menses: flow is moderate and usually lasting 6 to 7 days Bleeding: every 21 days Contraception: none DES exposure: denies Blood transfusions: none Sexually transmitted diseases: no past history Last pap: normal Date: 11/2018 OB History: G1, P1   Menstrual History: Patient's last menstrual period was 02/11/2019.    History reviewed. No pertinent past medical history.  Past Surgical History:  Procedure Laterality Date  . CESAREAN SECTION    . CHOLECYSTECTOMY      History reviewed. No pertinent family history.  Social History:  reports that she has never smoked. She has never used smokeless tobacco. She reports that she does not drink alcohol or use drugs.  Allergies: No Known Allergies  (Not in a hospital admission)   Review of  Systems  Constitutional: Positive for fever.  Respiratory: Negative.   Cardiovascular: Negative.   Gastrointestinal: Positive for heartburn and vomiting.  Genitourinary: Positive for hematuria.  Psychiatric/Behavioral: Negative.     Blood pressure 138/82, pulse (!) 118, temperature (!) 101.2 F (38.4 C), temperature source Oral, resp. rate 20, height 5\' 4"  (1.626 m), weight 98.9 kg, last menstrual period 02/11/2019, SpO2 95 %. Physical Exam  Constitutional: She is oriented to person, place, and time. She appears well-developed. She appears distressed (mildly uncomfortable).  HENT:  Head: Normocephalic and atraumatic.  Respiratory: Effort normal. No respiratory distress.  GI: Soft. She exhibits no distension. There is abdominal tenderness (mild). There is no rebound and no guarding.  Neurological: She is alert and oriented to person, place, and time.  Skin: Skin is warm and dry.  Psychiatric: She has a normal mood and affect. Her behavior is normal.    Results for orders placed or performed during the hospital encounter of 02/20/19 (from the past 24 hour(s))  Lactic acid, plasma     Status: None   Collection Time: 02/20/19 11:18 AM  Result Value Ref Range   Lactic Acid, Venous 1.0 0.5 - 1.9 mmol/L  Comprehensive metabolic panel     Status: Abnormal   Collection Time: 02/20/19 11:18 AM  Result Value Ref Range   Sodium 134 (L) 135 - 145 mmol/L   Potassium 3.8 3.5 - 5.1 mmol/L   Chloride 99 98 - 111 mmol/L   CO2 22 22 - 32 mmol/L   Glucose, Bld 132 (H) 70 - 99 mg/dL  BUN 8 6 - 20 mg/dL   Creatinine, Ser 1.15 (H) 0.44 - 1.00 mg/dL   Calcium 8.7 (L) 8.9 - 10.3 mg/dL   Total Protein 7.4 6.5 - 8.1 g/dL   Albumin 2.5 (L) 3.5 - 5.0 g/dL   AST 29 15 - 41 U/L   ALT 39 0 - 44 U/L   Alkaline Phosphatase 187 (H) 38 - 126 U/L   Total Bilirubin 2.2 (H) 0.3 - 1.2 mg/dL   GFR calc non Af Amer 55 (L) >60 mL/min   GFR calc Af Amer >60 >60 mL/min   Anion gap 23 (H) 5 - 15  CBC with  Differential     Status: Abnormal   Collection Time: 02/20/19 11:18 AM  Result Value Ref Range   WBC 23.6 (H) 4.0 - 10.5 K/uL   RBC 3.24 (L) 3.87 - 5.11 MIL/uL   Hemoglobin 9.5 (L) 12.0 - 15.0 g/dL   HCT 28.2 (L) 36.0 - 46.0 %   MCV 87.0 80.0 - 100.0 fL   MCH 29.3 26.0 - 34.0 pg   MCHC 33.7 30.0 - 36.0 g/dL   RDW 14.8 11.5 - 15.5 %   Platelets 586 (H) 150 - 400 K/uL   nRBC 0.0 0.0 - 0.2 %   Neutrophils Relative % 89 %   Neutro Abs 20.8 (H) 1.7 - 7.7 K/uL   Lymphocytes Relative 7 %   Lymphs Abs 1.7 0.7 - 4.0 K/uL   Monocytes Relative 3 %   Monocytes Absolute 0.8 0.1 - 1.0 K/uL   Eosinophils Relative 0 %   Eosinophils Absolute 0.0 0.0 - 0.5 K/uL   Basophils Relative 0 %   Basophils Absolute 0.0 0.0 - 0.1 K/uL   Immature Granulocytes 1 %   Abs Immature Granulocytes 0.24 (H) 0.00 - 0.07 K/uL  I-Stat beta hCG blood, ED     Status: Abnormal   Collection Time: 02/20/19 11:43 AM  Result Value Ref Range   I-stat hCG, quantitative 6.4 (H) <5 mIU/mL   Comment 3          Urinalysis, Routine w reflex microscopic     Status: Abnormal   Collection Time: 02/20/19  3:40 PM  Result Value Ref Range   Color, Urine AMBER (A) YELLOW   APPearance HAZY (A) CLEAR   Specific Gravity, Urine 1.027 1.005 - 1.030   pH 5.0 5.0 - 8.0   Glucose, UA NEGATIVE NEGATIVE mg/dL   Hgb urine dipstick NEGATIVE NEGATIVE   Bilirubin Urine NEGATIVE NEGATIVE   Ketones, ur 20 (A) NEGATIVE mg/dL   Protein, ur 100 (A) NEGATIVE mg/dL   Nitrite NEGATIVE NEGATIVE   Leukocytes,Ua NEGATIVE NEGATIVE   RBC / HPF 0-5 0 - 5 RBC/hpf   WBC, UA 0-5 0 - 5 WBC/hpf   Bacteria, UA NONE SEEN NONE SEEN   Squamous Epithelial / LPF 0-5 0 - 5   Mucus PRESENT    Hyaline Casts, UA PRESENT     US Transvaginal Non-ob  Result Date: 02/19/2019 CLINICAL DATA:  Abnormal CT today. Abdominal pain started this morning. EXAM: TRANSABDOMINAL AND TRANSVAGINAL ULTRASOUND OF PELVIS DOPPLER ULTRASOUND OF OVARIES TECHNIQUE: Both transabdominal and  transvaginal ultrasound examinations of the pelvis were performed. Transabdominal technique was performed for global imaging of the pelvis including uterus, ovaries, adnexal regions, and pelvic cul-de-sac. It was necessary to proceed with endovaginal exam following the transabdominal exam to visualize the endometrium and adnexal regions. Color and duplex Doppler ultrasound was utilized to evaluate blood flow to the ovaries.  COMPARISON:  CT of the abdomen and pelvis on 02/19/2019 FINDINGS: Uterus Measurements: 11.0 x 5.5 x 7.4 centimeters = volume: 233.3 mL. Multiple fibroids are present. Anterior fibroid is 2.0 x 1.6 x 1.9 centimeters. Second fibroid is 0.8 x 0.7 x 0.8 centimeters. Endometrium Thickness: 10.0 millimeters.  No focal abnormality visualized. Right ovary Measurements: 6.1 x 5.4 x 5.5 centimeters = volume: 96.0 mL. A complex cystic multi-septated mass in the RIGHT ovary measures 3.9 x 3.4 x 5.0 centimeters. Mass has a thickened wall and irregular avascular septations. Left ovary Measurements: 3.4 x 3.6 x 3.6 centimeters = volume: 23.3 mL. Largest follicle is 2.7 centimeters. Pulsed Doppler evaluation of both ovaries demonstrates normal low-resistance arterial and venous waveforms. Other findings Small to moderate amount of free pelvic fluid. IMPRESSION: 1. Complex cystic mass in the RIGHT ovary with avascular septations measuring 3.9 centimeters. There is no evidence for ovarian torsion at this time. The appearance favors hemorrhagic cyst and follow-up is recommended. Recommend follow-up pelvic ultrasound in 6-12 weeks. 2. Uterine fibroids. 3. Normal appearance of the LEFT ovary. Electronically Signed   By: Nolon Nations M.D.   On: 02/19/2019 16:35   US Pelvis Complete  Result Date: 02/19/2019 CLINICAL DATA:  Abnormal CT today. Abdominal pain started this morning. EXAM: TRANSABDOMINAL AND TRANSVAGINAL ULTRASOUND OF PELVIS DOPPLER ULTRASOUND OF OVARIES TECHNIQUE: Both transabdominal and  transvaginal ultrasound examinations of the pelvis were performed. Transabdominal technique was performed for global imaging of the pelvis including uterus, ovaries, adnexal regions, and pelvic cul-de-sac. It was necessary to proceed with endovaginal exam following the transabdominal exam to visualize the endometrium and adnexal regions. Color and duplex Doppler ultrasound was utilized to evaluate blood flow to the ovaries. COMPARISON:  CT of the abdomen and pelvis on 02/19/2019 FINDINGS: Uterus Measurements: 11.0 x 5.5 x 7.4 centimeters = volume: 233.3 mL. Multiple fibroids are present. Anterior fibroid is 2.0 x 1.6 x 1.9 centimeters. Second fibroid is 0.8 x 0.7 x 0.8 centimeters. Endometrium Thickness: 10.0 millimeters.  No focal abnormality visualized. Right ovary Measurements: 6.1 x 5.4 x 5.5 centimeters = volume: 96.0 mL. A complex cystic multi-septated mass in the RIGHT ovary measures 3.9 x 3.4 x 5.0 centimeters. Mass has a thickened wall and irregular avascular septations. Left ovary Measurements: 3.4 x 3.6 x 3.6 centimeters = volume: 23.3 mL. Largest follicle is 2.7 centimeters. Pulsed Doppler evaluation of both ovaries demonstrates normal low-resistance arterial and venous waveforms. Other findings Small to moderate amount of free pelvic fluid. IMPRESSION: 1. Complex cystic mass in the RIGHT ovary with avascular septations measuring 3.9 centimeters. There is no evidence for ovarian torsion at this time. The appearance favors hemorrhagic cyst and follow-up is recommended. Recommend follow-up pelvic ultrasound in 6-12 weeks. 2. Uterine fibroids. 3. Normal appearance of the LEFT ovary. Electronically Signed   By: Nolon Nations M.D.   On: 02/19/2019 16:35   Ct Abdomen Pelvis W Contrast  Result Date: 02/19/2019 CLINICAL DATA:  Acute lower pelvic pain and cramping left worse than right radiating across the anterior abdomen beginning this morning. Decreased appetite 1 week. EXAM: CT ABDOMEN AND PELVIS WITH  CONTRAST TECHNIQUE: Multidetector CT imaging of the abdomen and pelvis was performed using the standard protocol following bolus administration of intravenous contrast. CONTRAST:  135mL OMNIPAQUE IOHEXOL 300 MG/ML  SOLN COMPARISON:  None. FINDINGS: Lower chest: Lung bases are within normal. Small to moderate size hiatal hernia. Hepatobiliary: Previous cholecystectomy. Liver and biliary tree are normal. Pancreas: Normal. Spleen: Normal. Adrenals/Urinary Tract: Adrenal glands are  normal. Kidneys are normal in size without hydronephrosis or nephrolithiasis. Ureters and bladder are normal. Stomach/Bowel: Small to moderate size hiatal hernia. Small bowel is within normal. Appendix is within normal. Colon is decompressed from the appendix flexure distally and otherwise unremarkable. Vascular/Lymphatic: Normal vasculature. Few small periaortic and right iliac chain lymph nodes. Reproductive: Uterus demonstrates a subcentimeter calcification over the fundus as the uterus and left ovary are otherwise normal. Right ovary is slightly enlarged measuring 5.2 x 6.6 x 6.8 cm and demonstrates cystic change there is mild stranding of the fat adjacent the right ovary and a small amount of free fluid in the pelvis. These findings could be seen due to torsion versus PID/tubo-ovarian abscess. Other: None. Musculoskeletal: No acute findings. IMPRESSION: 1. Slightly enlarged right ovary with cystic change and stranding of the adjacent fat as well as mild amount of adjacent free fluid. Findings may represent an acute process such as ovarian torsion or infection such as PID/tubo-ovarian abscess. Recommend pelvic ultrasound for further evaluation. 2.  Small to moderate size hiatal hernia. Electronically Signed   By: Marin Olp M.D.   On: 02/19/2019 15:00   [place] Art/ven Flow Abd Pelv Doppler  Result Date: 02/19/2019 CLINICAL DATA:  Abnormal CT today. Abdominal pain started this morning. EXAM: TRANSABDOMINAL AND TRANSVAGINAL ULTRASOUND OF  PELVIS DOPPLER ULTRASOUND OF OVARIES TECHNIQUE: Both transabdominal and transvaginal ultrasound examinations of the pelvis were performed. Transabdominal technique was performed for global imaging of the pelvis including uterus, ovaries, adnexal regions, and pelvic cul-de-sac. It was necessary to proceed with endovaginal exam following the transabdominal exam to visualize the endometrium and adnexal regions. Color and duplex Doppler ultrasound was utilized to evaluate blood flow to the ovaries. COMPARISON:  CT of the abdomen and pelvis on 02/19/2019 FINDINGS: Uterus Measurements: 11.0 x 5.5 x 7.4 centimeters = volume: 233.3 mL. Multiple fibroids are present. Anterior fibroid is 2.0 x 1.6 x 1.9 centimeters. Second fibroid is 0.8 x 0.7 x 0.8 centimeters. Endometrium Thickness: 10.0 millimeters.  No focal abnormality visualized. Right ovary Measurements: 6.1 x 5.4 x 5.5 centimeters = volume: 96.0 mL. A complex cystic multi-septated mass in the RIGHT ovary measures 3.9 x 3.4 x 5.0 centimeters. Mass has a thickened wall and irregular avascular septations. Left ovary Measurements: 3.4 x 3.6 x 3.6 centimeters = volume: 23.3 mL. Largest follicle is 2.7 centimeters. Pulsed Doppler evaluation of both ovaries demonstrates normal low-resistance arterial and venous waveforms. Other findings Small to moderate amount of free pelvic fluid. IMPRESSION: 1. Complex cystic mass in the RIGHT ovary with avascular septations measuring 3.9 centimeters. There is no evidence for ovarian torsion at this time. The appearance favors hemorrhagic cyst and follow-up is recommended. Recommend follow-up pelvic ultrasound in 6-12 weeks. 2. Uterine fibroids. 3. Normal appearance of the LEFT ovary. Electronically Signed   By: Nolon Nations M.D.   On: 02/19/2019 16:35    Assessment/Plan: Suspected PID with possible right TOA, failed outpatient treatment. She will be admitted for IV antibiotics.  Emeterio Reeve 02/20/2019, 5:54 PM

## 2019-02-20 NOTE — ED Provider Notes (Signed)
  Face-to-face evaluation   History: She presents for evaluation of abdominal pain, diarrhea, and fever.  Symptoms worsen since seen, evaluated and treated for pelvic infection, yesterday.  Physical exam: Alert, obese patient.  No respiratory distress.  No dysarthria or aphasia.  Patient is lucid.  Medical screening examination/treatment/procedure(s) were conducted as a shared visit with non-physician practitioner(s) and myself.  I personally evaluated the patient during the encounter   Daleen Bo, MD 02/20/19 [phone]

## 2019-02-20 NOTE — ED Provider Notes (Signed)
[place] EMERGENCY DEPARTMENT Provider Note   CSN: [id number] Arrival date & time: 02/20/19  1108     History   Chief Complaint No chief complaint on file.   HPI Sabrina Spence is a 53 y.o. female with a past medical who treated cholecystectomy presents today for evaluation of fevers, abdominal pain and generally feeling unwell.  She was seen yesterday at [facility] and there was concern for PID as she had cervical motion tenderness on exam.  Chart review shows that she was discharged with antibiotics, and given azithromycin and Rocephin.  She reports that her pain has continued to worsen and she has poor appetite.  Review shows that she had CT abdomen pelvis obtained showing an enlarged right ovary with cystic change concerning for ovarian torsion or PID/TOA.  Pelvic ultrasound showed a complex cystic mass on the right ovary with avascular septations concerning for a hemorrhagic cyst.    Patient reports that she is noting blood in her urine.  She reports urinary frequency since last night.   She reports a slight headache however denies recent coronavirus contacts or cough.  No shortness of breath.  She reports that she has had 3-4 episodes of diarrhea since last night.     HPI  History reviewed. No pertinent past medical history.  Patient Active Problem List   Diagnosis Date Noted   Foot pain, bilateral 03/26/2017    Past Surgical History:  Procedure Laterality Date   CESAREAN SECTION     CHOLECYSTECTOMY       OB History   No obstetric history on file.      Home Medications    Prior to Admission medications   Medication Sig Start Date End Date Taking? Authorizing Provider  amLODipine (NORVASC) 10 MG tablet TK 1 T PO D 12/23/18   [provider]  doxycycline (VIBRAMYCIN) 100 MG capsule Take 1 capsule (100 mg total) by mouth 2 (two) times daily for 14 days. 02/19/19 03/05/19  Albrizze, Kaitlyn E, PA-C  metroNIDAZOLE (FLAGYL) 500 MG  tablet Take 1 tablet (500 mg total) by mouth 2 (two) times daily for 14 days. 02/19/19 03/05/19  Albrizze, Harley Hallmark, PA-C    Family History History reviewed. No pertinent family history.  Social History Social History   Tobacco Use   Smoking status: Never Smoker   Smokeless tobacco: Never Used  Substance Use Topics   Alcohol use: Never    Alcohol/week: 0.0 standard drinks    Frequency: Never   Drug use: Never     Allergies   Patient has no known allergies.   Review of Systems Review of Systems  Constitutional: Positive for chills, fatigue and fever.  Respiratory: Negative for chest tightness and shortness of breath.   Cardiovascular: Negative for chest pain.  Gastrointestinal: Positive for abdominal pain and diarrhea. Negative for nausea and vomiting.  Genitourinary: Positive for dysuria, pelvic pain and urgency. Negative for genital sores, vaginal bleeding, vaginal discharge and vaginal pain.  Musculoskeletal: Negative for back pain and neck pain.  Neurological: Positive for headaches. Negative for weakness.  All other systems reviewed and are negative.    Physical Exam Updated Vital Signs BP 138/82 (BP Location: Right Arm)    Pulse (!) 118    Temp (!) 101.2 F (38.4 C) (Oral)    Resp 20    Ht 5\' 4"  (1.626 m)    Wt 98.9 kg    LMP 02/11/2019    SpO2 95%    BMI  37.42 kg/m   Physical Exam Vitals signs and nursing note reviewed.  Constitutional:      General: She is not in acute distress.    Appearance: She is well-developed.  HENT:     Head: Normocephalic and atraumatic.     Mouth/Throat:     Mouth: Mucous membranes are moist.  Eyes:     Conjunctiva/sclera: Conjunctivae normal.  Neck:     Musculoskeletal: Normal range of motion and neck supple. No neck rigidity.  Cardiovascular:     Rate and Rhythm: Regular rhythm. Tachycardia present.     Heart sounds: No murmur.  Pulmonary:     Effort: Pulmonary effort is normal. No respiratory distress.     Breath  sounds: Normal breath sounds.  Abdominal:     General: Bowel sounds are normal.     Palpations: Abdomen is soft.     Tenderness: There is abdominal tenderness in the right lower quadrant, periumbilical area, suprapubic area and left lower quadrant.  Skin:    General: Skin is warm and dry.  Neurological:     General: No focal deficit present.     Mental Status: She is alert and oriented to person, place, and time.  Psychiatric:        Mood and Affect: Mood normal.        Behavior: Behavior normal.      ED Treatments / Results  Labs (all labs ordered are listed, but only abnormal results are displayed) Labs Reviewed  COMPREHENSIVE METABOLIC PANEL - Abnormal; Notable for the following components:      Result Value   Sodium 134 (*)    Glucose, Bld 132 (*)    Creatinine, Ser 1.15 (*)    Calcium 8.7 (*)    Albumin 2.5 (*)    Alkaline Phosphatase 187 (*)    Total Bilirubin 2.2 (*)    GFR calc non Af Amer 55 (*)    Anion gap 23 (*)    All other components within normal limits  CBC WITH DIFFERENTIAL/PLATELET - Abnormal; Notable for the following components:   WBC 23.6 (*)    RBC 3.24 (*)    Hemoglobin 9.5 (*)    HCT 28.2 (*)    Platelets 586 (*)    Neutro Abs 20.8 (*)    Abs Immature Granulocytes 0.24 (*)    All other components within normal limits  URINALYSIS, ROUTINE W REFLEX MICROSCOPIC - Abnormal; Notable for the following components:   Color, Urine AMBER (*)    APPearance HAZY (*)    Ketones, ur 20 (*)    Protein, ur 100 (*)    All other components within normal limits  I-STAT BETA HCG BLOOD, ED ([facility], [facility], [facility] ONLY) - Abnormal; Notable for the following components:   I-stat hCG, quantitative 6.4 (*)    All other components within normal limits  SARS CORONAVIRUS 2 (HOSPITAL ORDER, [facility])  LACTIC ACID, PLASMA  LACTIC ACID, PLASMA    EKG None  Radiology- obtained yesterday at previous visit US Transvaginal Non-ob  Result Date:  02/19/2019 CLINICAL DATA:  Abnormal CT today. Abdominal pain started this morning. EXAM: TRANSABDOMINAL AND TRANSVAGINAL ULTRASOUND OF PELVIS DOPPLER ULTRASOUND OF OVARIES TECHNIQUE: Both transabdominal and transvaginal ultrasound examinations of the pelvis were performed. Transabdominal technique was performed for global imaging of the pelvis including uterus, ovaries, adnexal regions, and pelvic cul-de-sac. It was necessary to proceed with endovaginal exam following the transabdominal exam to visualize the endometrium and adnexal regions.  Color and duplex Doppler ultrasound was utilized to evaluate blood flow to the ovaries. COMPARISON:  CT of the abdomen and pelvis on 02/19/2019 FINDINGS: Uterus Measurements: 11.0 x 5.5 x 7.4 centimeters = volume: 233.3 mL. Multiple fibroids are present. Anterior fibroid is 2.0 x 1.6 x 1.9 centimeters. Second fibroid is 0.8 x 0.7 x 0.8 centimeters. Endometrium Thickness: 10.0 millimeters.  No focal abnormality visualized. Right ovary Measurements: 6.1 x 5.4 x 5.5 centimeters = volume: 96.0 mL. A complex cystic multi-septated mass in the RIGHT ovary measures 3.9 x 3.4 x 5.0 centimeters. Mass has a thickened wall and irregular avascular septations. Left ovary Measurements: 3.4 x 3.6 x 3.6 centimeters = volume: 23.3 mL. Largest follicle is 2.7 centimeters. Pulsed Doppler evaluation of both ovaries demonstrates normal low-resistance arterial and venous waveforms. Other findings Small to moderate amount of free pelvic fluid. IMPRESSION: 1. Complex cystic mass in the RIGHT ovary with avascular septations measuring 3.9 centimeters. There is no evidence for ovarian torsion at this time. The appearance favors hemorrhagic cyst and follow-up is recommended. Recommend follow-up pelvic ultrasound in 6-12 weeks. 2. Uterine fibroids. 3. Normal appearance of the LEFT ovary. Electronically Signed   By: Nolon Nations M.D.   On: 02/19/2019 16:35   US Pelvis Complete  Result Date:  02/19/2019 CLINICAL DATA:  Abnormal CT today. Abdominal pain started this morning. EXAM: TRANSABDOMINAL AND TRANSVAGINAL ULTRASOUND OF PELVIS DOPPLER ULTRASOUND OF OVARIES TECHNIQUE: Both transabdominal and transvaginal ultrasound examinations of the pelvis were performed. Transabdominal technique was performed for global imaging of the pelvis including uterus, ovaries, adnexal regions, and pelvic cul-de-sac. It was necessary to proceed with endovaginal exam following the transabdominal exam to visualize the endometrium and adnexal regions. Color and duplex Doppler ultrasound was utilized to evaluate blood flow to the ovaries. COMPARISON:  CT of the abdomen and pelvis on 02/19/2019 FINDINGS: Uterus Measurements: 11.0 x 5.5 x 7.4 centimeters = volume: 233.3 mL. Multiple fibroids are present. Anterior fibroid is 2.0 x 1.6 x 1.9 centimeters. Second fibroid is 0.8 x 0.7 x 0.8 centimeters. Endometrium Thickness: 10.0 millimeters.  No focal abnormality visualized. Right ovary Measurements: 6.1 x 5.4 x 5.5 centimeters = volume: 96.0 mL. A complex cystic multi-septated mass in the RIGHT ovary measures 3.9 x 3.4 x 5.0 centimeters. Mass has a thickened wall and irregular avascular septations. Left ovary Measurements: 3.4 x 3.6 x 3.6 centimeters = volume: 23.3 mL. Largest follicle is 2.7 centimeters. Pulsed Doppler evaluation of both ovaries demonstrates normal low-resistance arterial and venous waveforms. Other findings Small to moderate amount of free pelvic fluid. IMPRESSION: 1. Complex cystic mass in the RIGHT ovary with avascular septations measuring 3.9 centimeters. There is no evidence for ovarian torsion at this time. The appearance favors hemorrhagic cyst and follow-up is recommended. Recommend follow-up pelvic ultrasound in 6-12 weeks. 2. Uterine fibroids. 3. Normal appearance of the LEFT ovary. Electronically Signed   By: Nolon Nations M.D.   On: 02/19/2019 16:35   Ct Abdomen Pelvis W Contrast  Result Date:  02/19/2019 CLINICAL DATA:  Acute lower pelvic pain and cramping left worse than right radiating across the anterior abdomen beginning this morning. Decreased appetite 1 week. EXAM: CT ABDOMEN AND PELVIS WITH CONTRAST TECHNIQUE: Multidetector CT imaging of the abdomen and pelvis was performed using the standard protocol following bolus administration of intravenous contrast. CONTRAST:  148mL OMNIPAQUE IOHEXOL 300 MG/ML  SOLN COMPARISON:  None. FINDINGS: Lower chest: Lung bases are within normal. Small to moderate size hiatal hernia. Hepatobiliary: Previous cholecystectomy. Liver  and biliary tree are normal. Pancreas: Normal. Spleen: Normal. Adrenals/Urinary Tract: Adrenal glands are normal. Kidneys are normal in size without hydronephrosis or nephrolithiasis. Ureters and bladder are normal. Stomach/Bowel: Small to moderate size hiatal hernia. Small bowel is within normal. Appendix is within normal. Colon is decompressed from the appendix flexure distally and otherwise unremarkable. Vascular/Lymphatic: Normal vasculature. Few small periaortic and right iliac chain lymph nodes. Reproductive: Uterus demonstrates a subcentimeter calcification over the fundus as the uterus and left ovary are otherwise normal. Right ovary is slightly enlarged measuring 5.2 x 6.6 x 6.8 cm and demonstrates cystic change there is mild stranding of the fat adjacent the right ovary and a small amount of free fluid in the pelvis. These findings could be seen due to torsion versus PID/tubo-ovarian abscess. Other: None. Musculoskeletal: No acute findings. IMPRESSION: 1. Slightly enlarged right ovary with cystic change and stranding of the adjacent fat as well as mild amount of adjacent free fluid. Findings may represent an acute process such as ovarian torsion or infection such as PID/tubo-ovarian abscess. Recommend pelvic ultrasound for further evaluation. 2.  Small to moderate size hiatal hernia. Electronically Signed   By: Marin Olp M.D.    On: 02/19/2019 15:00   [place] Art/ven Flow Abd Pelv Doppler  Result Date: 02/19/2019 CLINICAL DATA:  Abnormal CT today. Abdominal pain started this morning. EXAM: TRANSABDOMINAL AND TRANSVAGINAL ULTRASOUND OF PELVIS DOPPLER ULTRASOUND OF OVARIES TECHNIQUE: Both transabdominal and transvaginal ultrasound examinations of the pelvis were performed. Transabdominal technique was performed for global imaging of the pelvis including uterus, ovaries, adnexal regions, and pelvic cul-de-sac. It was necessary to proceed with endovaginal exam following the transabdominal exam to visualize the endometrium and adnexal regions. Color and duplex Doppler ultrasound was utilized to evaluate blood flow to the ovaries. COMPARISON:  CT of the abdomen and pelvis on 02/19/2019 FINDINGS: Uterus Measurements: 11.0 x 5.5 x 7.4 centimeters = volume: 233.3 mL. Multiple fibroids are present. Anterior fibroid is 2.0 x 1.6 x 1.9 centimeters. Second fibroid is 0.8 x 0.7 x 0.8 centimeters. Endometrium Thickness: 10.0 millimeters.  No focal abnormality visualized. Right ovary Measurements: 6.1 x 5.4 x 5.5 centimeters = volume: 96.0 mL. A complex cystic multi-septated mass in the RIGHT ovary measures 3.9 x 3.4 x 5.0 centimeters. Mass has a thickened wall and irregular avascular septations. Left ovary Measurements: 3.4 x 3.6 x 3.6 centimeters = volume: 23.3 mL. Largest follicle is 2.7 centimeters. Pulsed Doppler evaluation of both ovaries demonstrates normal low-resistance arterial and venous waveforms. Other findings Small to moderate amount of free pelvic fluid. IMPRESSION: 1. Complex cystic mass in the RIGHT ovary with avascular septations measuring 3.9 centimeters. There is no evidence for ovarian torsion at this time. The appearance favors hemorrhagic cyst and follow-up is recommended. Recommend follow-up pelvic ultrasound in 6-12 weeks. 2. Uterine fibroids. 3. Normal appearance of the LEFT ovary. Electronically Signed   By: Nolon Nations M.D.    On: 02/19/2019 16:35    Procedures .Critical Care Performed by: Lorin Glass, PA-C Authorized by: Lorin Glass, PA-C   Critical care provider statement:    Critical care time (minutes):  45   Critical care was necessary to treat or prevent imminent or life-threatening deterioration of the following conditions:  Sepsis   Critical care was time spent personally by me on the following activities:  Discussions with consultants, evaluation of patient's response to treatment, examination of patient, ordering and performing treatments and interventions, ordering and review of laboratory studies, ordering and review  of radiographic studies, pulse oximetry, re-evaluation of patient's condition, obtaining history from patient or surrogate and review of old charts   (including critical care time)  Medications Ordered in ED Medications  sodium chloride flush (NS) 0.9 % injection 3 mL (has no administration in time range)  sodium chloride 0.9 % bolus 1,000 mL (has no administration in time range)  piperacillin-tazobactam (ZOSYN) IVPB 3.375 g (has no administration in time range)  acetaminophen (TYLENOL) tablet 1,000 mg (1,000 mg Oral Given 02/20/19 1708)     Initial Impression / Assessment and Plan / ED Course  I have reviewed the triage vital signs and the nursing notes.  Pertinent labs & imaging results that were available during my care of the patient were reviewed by me and considered in my medical decision making (see chart for details).  Clinical Course as of Feb 20 1712  Thu Feb 20, 2019  1711 Spoke with Dr. Roselie Awkward of OB/GYN who will be by to see the patient for admission.   [EH]    Clinical Course User Index [EH] Lorin Glass, PA-C      Patient presents today for evaluation of continued abdominal pain.  She was seen yesterday at [facility] where she had an ultrasound and CT scan obtained.  CT scan showed concern for TOA versus PID.  Ultrasound  shows concern for right-sided hemorrhagic cyst.  She was discharged with prescriptions for antibiotics after being given empiric Rocephin and azithromycin.  Here today she meets sepsis criteria.  She is tachycardic and febrile with borderline tachypnea.  Her white count has elevated from 18 yesterday up to 23.6 today.  Her hemoglobin has dropped slightly from 10.3-9.5.  Her lactic acid is not significantly elevated.  Chart review shows that blood cultures along with pelvic exam and GC testing was obtained yesterday.  Chart review also shows that yesterday on pelvic exam she had cervical motion tenderness.  Her urine does not appear infected today with microscopic exam showing 0-5 reds, whites and squamous cells with no bacteria seen.  As patient will be admitted to OB/GYN where there are multiple patients that would be considered high risk for coronavirus in the setting of her fever rapid coronavirus test was sent.  I spoke with Dr. Roselie Awkward from OB/GYN who request Zosyn.  Patient was given a dose of Tylenol while in the emergency room for fever.  Urine yesterday was negative for pregnancy.  Today i-STAT beta-hCG is 6.4, however I suspect that this is more likely a false slight elevation rather than a true positive.   Final Clinical Impressions(s) / ED Diagnoses   Final diagnoses:  PID (acute pelvic inflammatory disease)  Sepsis without acute organ dysfunction, due to unspecified organism [facility])    ED Discharge Orders    None       Ollen Gross 02/20/19 2112    Daleen Bo, MD 02/20/19 [phone]

## 2019-02-21 DIAGNOSIS — N730 Acute parametritis and pelvic cellulitis: Secondary | ICD-10-CM

## 2019-02-21 LAB — BASIC METABOLIC PANEL
Anion gap: 11 (ref 5–15)
BUN: 10 mg/dL (ref 6–20)
CO2: 22 mmol/L (ref 22–32)
Calcium: 8.4 mg/dL — ABNORMAL LOW (ref 8.9–10.3)
Chloride: 102 mmol/L (ref 98–111)
Creatinine, Ser: 1.06 mg/dL — ABNORMAL HIGH (ref 0.44–1.00)
GFR calc Af Amer: >60 mL/min (ref >60)
GFR calc non Af Amer: >60 mL/min (ref >60)
Glucose, Bld: 106 mg/dL — ABNORMAL HIGH (ref 70–99)
Potassium: 3.7 mmol/L (ref 3.5–5.1)
Sodium: 135 mmol/L (ref 135–145)

## 2019-02-21 LAB — CBC
HCT: 26.3 % — ABNORMAL LOW (ref 36.0–46.0)
Hemoglobin: 8.4 g/dL — ABNORMAL LOW (ref 12.0–15.0)
MCH: 28.7 pg (ref 26.0–34.0)
MCHC: 31.9 g/dL (ref 30.0–36.0)
MCV: 89.8 fL (ref 80.0–100.0)
Platelets: 560 10*3/uL — ABNORMAL HIGH (ref 150–400)
RBC: 2.93 MIL/uL — ABNORMAL LOW (ref 3.87–5.11)
RDW: 15 % (ref 11.5–15.5)
WBC: 23.2 10*3/uL — ABNORMAL HIGH (ref 4.0–10.5)
nRBC: 0 % (ref 0.0–0.2)

## 2019-02-21 LAB — CERVICOVAGINAL ANCILLARY ONLY
Chlamydia: NEGATIVE
Neisseria Gonorrhea: NEGATIVE

## 2019-02-21 LAB — HIV ANTIBODY (ROUTINE TESTING W REFLEX): HIV Screen 4th Generation wRfx: NONREACTIVE

## 2019-02-21 LAB — HCG, QUANTITATIVE, PREGNANCY: hCG, Beta Chain, Quant, S: 3 m[IU]/mL (ref <5)

## 2019-02-21 MED ORDER — METRONIDAZOLE 500 MG PO TABS
500.0000 mg | ORAL_TABLET | Freq: Two times a day (BID) | ORAL | Status: DC
  Administered 2019-02-21 – 2019-02-24 (×7): 500 mg via ORAL
  Filled 2019-02-21 (×8): qty 1

## 2019-02-21 NOTE — Progress Notes (Signed)
Subjective: still has pain Patient reports tolerating PO and no problems voiding.    Objective: I have reviewed patient's vital signs. Blood pressure 135/83, pulse (!) 121, temperature 98.3 F (36.8 C), temperature source Oral, resp. rate 18, height 5\' 4"  (1.626 m), weight 98.9 kg, last menstrual period 02/11/2019, SpO2 92 %.  General: alert, cooperative and no distress GI: soft, non-tender; bowel sounds normal; no masses,  no organomegaly Extremities: extremities normal, atraumatic, no cyanosis or edema   Assessment/Plan: PID, possible TOA Continue IV antibiotics  LOS: 1 day    Sabrina Spence 02/21/2019, 8:20 AM

## 2019-02-21 NOTE — ED Notes (Signed)
Report called to 5C 

## 2019-02-22 DIAGNOSIS — N7093 Salpingitis and oophoritis, unspecified: Principal | ICD-10-CM

## 2019-02-22 LAB — CBC
HCT: 26.1 % — ABNORMAL LOW (ref 36.0–46.0)
Hemoglobin: 9.1 g/dL — ABNORMAL LOW (ref 12.0–15.0)
MCH: 29.4 pg (ref 26.0–34.0)
MCHC: 34.9 g/dL (ref 30.0–36.0)
MCV: 84.5 fL (ref 80.0–100.0)
Platelets: 611 10*3/uL — ABNORMAL HIGH (ref 150–400)
RBC: 3.09 MIL/uL — ABNORMAL LOW (ref 3.87–5.11)
RDW: 14.6 % (ref 11.5–15.5)
WBC: 20.4 10*3/uL — ABNORMAL HIGH (ref 4.0–10.5)
nRBC: 0 % (ref 0.0–0.2)

## 2019-02-22 MED ORDER — ACETAMINOPHEN 500 MG PO TABS
1000.0000 mg | ORAL_TABLET | Freq: Once | ORAL | Status: AC
  Administered 2019-02-22: 1000 mg via ORAL
  Filled 2019-02-22: qty 2

## 2019-02-22 NOTE — Progress Notes (Signed)
HD # 2 Subjective: Patient reports feeling better. Min Abd pain now. Some N/V at times. O/W tolerating diet. Up to restroom without problems.   Objective: AF VSS Lungs clear Heart RRR Abd soft + BS min diffuse abd tenderness no rebound  Assessment/Plan: RTOA  Continues to improve. AF since admission. BC negative, WBC decreasing and pain improved. Continue with current management. Repeat CBC in AM if continues to improve along with PE, probable discharge home tomorrow.    LOS: 2 days    Chancy Milroy 02/22/2019, 11:51 AM

## 2019-02-22 NOTE — Progress Notes (Signed)
Patient now on green MEWS with VSS and normal limits, pain at 2/10.  Patient watching TV.  Will complete RED MEWS protocol.

## 2019-02-23 DIAGNOSIS — N730 Acute parametritis and pelvic cellulitis: Secondary | ICD-10-CM

## 2019-02-23 LAB — CBC WITH DIFFERENTIAL/PLATELET
Abs Immature Granulocytes: 0.15 10*3/uL — ABNORMAL HIGH (ref 0.00–0.07)
Basophils Absolute: 0 10*3/uL (ref 0.0–0.1)
Basophils Relative: 0 %
Eosinophils Absolute: 0.1 10*3/uL (ref 0.0–0.5)
Eosinophils Relative: 1 %
HCT: 25.4 % — ABNORMAL LOW (ref 36.0–46.0)
Hemoglobin: 8.7 g/dL — ABNORMAL LOW (ref 12.0–15.0)
Immature Granulocytes: 1 %
Lymphocytes Relative: 9 %
Lymphs Abs: 1.4 10*3/uL (ref 0.7–4.0)
MCH: 29.1 pg (ref 26.0–34.0)
MCHC: 34.3 g/dL (ref 30.0–36.0)
MCV: 84.9 fL (ref 80.0–100.0)
Monocytes Absolute: 0.9 10*3/uL (ref 0.1–1.0)
Monocytes Relative: 6 %
Neutro Abs: 13.1 10*3/uL — ABNORMAL HIGH (ref 1.7–7.7)
Neutrophils Relative %: 83 %
Platelets: 658 10*3/uL — ABNORMAL HIGH (ref 150–400)
RBC: 2.99 MIL/uL — ABNORMAL LOW (ref 3.87–5.11)
RDW: 14.8 % (ref 11.5–15.5)
WBC: 15.8 10*3/uL — ABNORMAL HIGH (ref 4.0–10.5)
nRBC: 0.1 % (ref 0.0–0.2)

## 2019-02-23 MED ORDER — PANTOPRAZOLE SODIUM 40 MG PO TBEC
40.0000 mg | DELAYED_RELEASE_TABLET | Freq: Every day | ORAL | Status: DC
  Administered 2019-02-23 – 2019-02-24 (×2): 40 mg via ORAL
  Filled 2019-02-23 (×2): qty 1

## 2019-02-23 MED ORDER — IBUPROFEN 600 MG PO TABS: 600.0000 mg | ORAL_TABLET | Freq: Four times a day (QID) | ORAL | 3 refills | Status: AC | PRN

## 2019-02-23 MED ORDER — IBUPROFEN 600 MG PO TABS
600.0000 mg | ORAL_TABLET | Freq: Four times a day (QID) | ORAL | Status: DC
  Administered 2019-02-23 – 2019-02-24 (×5): 600 mg via ORAL
  Filled 2019-02-23 (×5): qty 1

## 2019-02-23 MED ORDER — PROMETHAZINE HCL 25 MG/ML IJ SOLN: 25.0000 mg | Freq: Four times a day (QID) | INTRAMUSCULAR | Status: DC | PRN

## 2019-02-23 MED ORDER — PROMETHAZINE HCL 25 MG PO TABS: 25.0000 mg | ORAL_TABLET | Freq: Four times a day (QID) | ORAL | Status: DC | PRN

## 2019-02-23 MED ORDER — IBUPROFEN 600 MG PO TABS: 600.0000 mg | ORAL_TABLET | Freq: Four times a day (QID) | ORAL | 0 refills | Status: DC | PRN

## 2019-02-23 MED ORDER — OXYCODONE-ACETAMINOPHEN 5-325 MG PO TABS: 1.0000 | ORAL_TABLET | ORAL | 0 refills | Status: DC | PRN

## 2019-02-23 NOTE — Progress Notes (Signed)
Subjective: Patient reports not feeling well this morning. She experienced some emesis and reports persistent abdominal pain which seems to be worst in comparison to yesterday.   Objective: I have reviewed patient's vital signs, medications and labs.  GENERAL: Well-developed, well-nourished female in no acute distress.  LUNGS: Clear to auscultation bilaterally.  HEART: Regular rate and rhythm. ABDOMEN: Soft, nondistended, tenderness in LLQ and suprapubic region. No organomegaly. PELVIC: Not performed EXTREMITIES: No cyanosis, clubbing, or edema, 2+ distal pulses.   Assessment/Plan: 53 yo admitted with PID/TOA - continue IV antibiotics - Will add protonix for GI prophylaxis - Ibuprofen added to pain management  - Continue close monitoring - Repeat CBC in am - Continue current care    LOS: 3 days    Mont Jagoda 02/23/2019, 10:25 AM

## 2019-02-24 ENCOUNTER — Encounter: Payer: Self-pay | Admitting: Obstetrics and Gynecology

## 2019-02-24 LAB — COMPREHENSIVE METABOLIC PANEL
ALT: 86 U/L — ABNORMAL HIGH (ref 0–44)
AST: 49 U/L — ABNORMAL HIGH (ref 15–41)
Albumin: 1.8 g/dL — ABNORMAL LOW (ref 3.5–5.0)
Alkaline Phosphatase: 134 U/L — ABNORMAL HIGH (ref 38–126)
Anion gap: 8 (ref 5–15)
BUN: 5 mg/dL — ABNORMAL LOW (ref 6–20)
CO2: 29 mmol/L (ref 22–32)
Calcium: 8.2 mg/dL — ABNORMAL LOW (ref 8.9–10.3)
Chloride: 100 mmol/L (ref 98–111)
Creatinine, Ser: 0.78 mg/dL (ref 0.44–1.00)
GFR calc Af Amer: >60 mL/min (ref >60)
GFR calc non Af Amer: >60 mL/min (ref >60)
Glucose, Bld: 101 mg/dL — ABNORMAL HIGH (ref 70–99)
Potassium: 3.4 mmol/L — ABNORMAL LOW (ref 3.5–5.1)
Sodium: 137 mmol/L (ref 135–145)
Total Bilirubin: 0.8 mg/dL (ref 0.3–1.2)
Total Protein: 6 g/dL — ABNORMAL LOW (ref 6.5–8.1)

## 2019-02-24 LAB — CULTURE, BLOOD (ROUTINE X 2)
Culture: NO GROWTH
Culture: NO GROWTH
Special Requests: ADEQUATE
Special Requests: ADEQUATE

## 2019-02-24 LAB — RPR: RPR Ser Ql: NONREACTIVE

## 2019-02-24 LAB — CBC
HCT: 23.7 % — ABNORMAL LOW (ref 36.0–46.0)
Hemoglobin: 8.2 g/dL — ABNORMAL LOW (ref 12.0–15.0)
MCH: 29.1 pg (ref 26.0–34.0)
MCHC: 34.6 g/dL (ref 30.0–36.0)
MCV: 84 fL (ref 80.0–100.0)
Platelets: 600 10*3/uL — ABNORMAL HIGH (ref 150–400)
RBC: 2.82 MIL/uL — ABNORMAL LOW (ref 3.87–5.11)
RDW: 14.6 % (ref 11.5–15.5)
WBC: 14.1 10*3/uL — ABNORMAL HIGH (ref 4.0–10.5)
nRBC: 0.2 % (ref 0.0–0.2)

## 2019-02-24 MED ORDER — PANTOPRAZOLE SODIUM 40 MG PO TBEC: 40.0000 mg | DELAYED_RELEASE_TABLET | Freq: Every day | ORAL | 1 refills | Status: DC

## 2019-02-24 MED ORDER — PROMETHAZINE HCL 25 MG PO TABS: 25.0000 mg | ORAL_TABLET | Freq: Four times a day (QID) | ORAL | 0 refills | Status: DC | PRN

## 2019-02-24 NOTE — Discharge Summary (Signed)
Physician Discharge Summary  Patient ID: Sabrina Spence MRN: [id number] DOB/AGE: 53-Jul-1967 53 y.o.  Admit date: 02/20/2019 Discharge date: 02/24/2019  Admission Diagnoses: Right TOA  Discharge Diagnoses: SAA plus elevated LFT's  Discharged Condition: good  Hospital Course: Sabrina Spence was admitted with Dx of right TOA. Placed on IV antibiotics. She responded well with resolution of her pain. She had some N/V which was treated with Protonix and antiemetic medications. Again she had a good response and was tolerating regular diet at time of discharge. She became afebrile and cultures returned as negative.  On HD # 4 it was felt that she was amendable for discharge now.  Of note laboratory evaluation revealed a slight increase in her LFT's. Discussed having GI see pt in hospital prior to discharge. Pt preferred to see her PCP  [facility] [place]). I felt this was reasonable.  Discharge medications and follow up were reviewed with pt pt. She verbalized understanding.   Consults: None  Significant Diagnostic Studies: labs and microbiology   Treatments: antibiotics  Discharge Exam: Blood pressure (!) 146/81, pulse 76, temperature 97.7 F (36.5 C), temperature source Oral, resp. rate 14, height 5\' 4"  (1.626 m), weight 98.9 kg, last menstrual period 02/11/2019, SpO2 100 %. Lungs clear Heart RRR Abd soft + BS non tender Ext non tender  Disposition: Discharge disposition: 01-Home or Self Care       Discharge Instructions    Call MD for:  difficulty breathing, headache or visual disturbances   Complete by: As directed    Call MD for:  extreme fatigue   Complete by: As directed    Call MD for:  hives   Complete by: As directed    Call MD for:  persistant dizziness or light-headedness   Complete by: As directed    Call MD for:  persistant nausea and vomiting   Complete by: As directed    Call MD for:  severe uncontrolled pain   Complete by: As directed    Call MD for:   temperature >100.4   Complete by: As directed    Diet - low sodium heart healthy   Complete by: As directed    Increase activity slowly   Complete by: As directed      Allergies as of 02/24/2019   No Known Allergies     Medication List    TAKE these medications   amLODipine 10 MG tablet Commonly known as: NORVASC Take 10 mg by mouth daily.   doxycycline 100 MG capsule Commonly known as: VIBRAMYCIN Take 1 capsule (100 mg total) by mouth 2 (two) times daily for 14 days.   ibuprofen 600 MG tablet Commonly known as: ADVIL Take 1 tablet (600 mg total) by mouth every 6 (six) hours as needed.   ibuprofen 600 MG tablet Commonly known as: ADVIL Take 1 tablet (600 mg total) by mouth every 6 (six) hours as needed.   metroNIDAZOLE 500 MG tablet Commonly known as: FLAGYL Take 1 tablet (500 mg total) by mouth 2 (two) times daily for 14 days.   oxyCODONE-acetaminophen 5-325 MG tablet Commonly known as: PERCOCET/ROXICET Take 1 tablet by mouth every 3 (three) hours as needed (moderate to severe pain (when tolerating fluids)).   pantoprazole 40 MG tablet Commonly known as: PROTONIX Take 1 tablet (40 mg total) by mouth daily. Start taking on: February 25, 2019   promethazine 25 MG tablet Commonly known as: PHENERGAN Take 1 tablet (25 mg total) by mouth every 6 (six) hours as needed  for nausea or vomiting.      Follow-up Information    [facility] Follow up.   Why: As scheduled on 03/06/2019 Contact information: [place] [id number] [phone]       [facility] [place]. Schedule an appointment as soon as possible for a visit.   Why: 1 week for follow up of elevated LFT's          Signed: Chancy Milroy 02/24/2019, 12:42 PM

## 2019-03-06 ENCOUNTER — Encounter: Payer: Self-pay | Admitting: *Deleted

## 2019-03-06 ENCOUNTER — Ambulatory Visit: Payer: 59 | Admitting: Obstetrics & Gynecology

## 2019-03-06 ENCOUNTER — Other Ambulatory Visit: Payer: Self-pay

## 2019-03-06 ENCOUNTER — Encounter: Payer: Self-pay | Admitting: Obstetrics & Gynecology

## 2019-03-06 VITALS — BP 161/95 | HR 83 | Ht 64.0 in | Wt 209.0 lb

## 2019-03-06 DIAGNOSIS — N921 Excessive and frequent menstruation with irregular cycle: Secondary | ICD-10-CM

## 2019-03-06 DIAGNOSIS — N838 Other noninflammatory disorders of ovary, fallopian tube and broad ligament: Secondary | ICD-10-CM

## 2019-03-06 DIAGNOSIS — N946 Dysmenorrhea, unspecified: Secondary | ICD-10-CM | POA: Diagnosis not present

## 2019-03-06 DIAGNOSIS — N941 Unspecified dyspareunia: Secondary | ICD-10-CM

## 2019-03-06 DIAGNOSIS — N730 Acute parametritis and pelvic cellulitis: Secondary | ICD-10-CM

## 2019-03-06 MED ORDER — MEGESTROL ACETATE 40 MG PO TABS: ORAL_TABLET | ORAL | 3 refills | Status: DC

## 2019-03-06 NOTE — Progress Notes (Signed)
Chief Complaint  Patient presents with  . cyst on right ovary      53 y.o. G1P1001 Patient's last menstrual period was 03/06/2019. The current method of family planning is condoms  Outpatient Encounter Medications as of 03/06/2019  Medication Sig  . amLODipine (NORVASC) 10 MG tablet Take 10 mg by mouth daily.   Marland Kitchen ibuprofen (ADVIL) 600 MG tablet Take 1 tablet (600 mg total) by mouth every 6 (six) hours as needed.  Marland Kitchen UNABLE TO FIND Antibiotic-unsure of name-BID  . megestrol (MEGACE) 40 MG tablet 3 tablets a day for 5 days, 2 tablets a day for 5 days then 1 tablet daily  . [DISCONTINUED] ibuprofen (ADVIL) 600 MG tablet Take 1 tablet (600 mg total) by mouth every 6 (six) hours as needed.  . [DISCONTINUED] oxyCODONE-acetaminophen (PERCOCET/ROXICET) 5-325 MG tablet Take 1 tablet by mouth every 3 (three) hours as needed (moderate to severe pain (when tolerating fluids)).  . [DISCONTINUED] pantoprazole (PROTONIX) 40 MG tablet Take 1 tablet (40 mg total) by mouth daily.  . [DISCONTINUED] promethazine (PHENERGAN) 25 MG tablet Take 1 tablet (25 mg total) by mouth every 6 (six) hours as needed for nausea or vomiting.   No facility-administered encounter medications on file as of 03/06/2019.     Subjective Pt is seen for follow up of her recent hospitalization revealing a complex right ovarian mass, possible PID/TOA with elevated WBC and fever associated.  She has long standing menorrhagia with associated anemis, MCV 84, and also 75% dysparunia, bump.   I reviewed admission/progress/discharge notes, all labs and scans from her recent hospitalization.  Discussed findings with patient and recommend a mini lap TAH BSO with 1 night hospital stay Past Medical History:  Diagnosis Date  . Hypertension     Past Surgical History:  Procedure Laterality Date  . CESAREAN SECTION    . CHOLECYSTECTOMY      OB History    Gravida  1   Para  1   Term  1   Preterm      AB      Living  1      SAB      TAB      Ectopic      Multiple      Live Births  1           No Known Allergies  Social History   Socioeconomic History  . Marital status: Divorced    Spouse name: Not on file  . Number of children: Not on file  . Years of education: Not on file  . Highest education level: Not on file  Occupational History  . Not on file  Social Needs  . Financial resource strain: Not on file  . Food insecurity    Worry: Not on file    Inability: Not on file  . Transportation needs    Medical: Not on file    Non-medical: Not on file  Tobacco Use  . Smoking status: Never Smoker  . Smokeless tobacco: Never Used  Substance and Sexual Activity  . Alcohol use: Never    Alcohol/week: 0.0 standard drinks    Frequency: Never  . Drug use: Never  . Sexual activity: Yes    Birth control/protection: None  Lifestyle  . Physical activity    Days per week: Not on file    Minutes per session: Not on file  . Stress: Not on file  Relationships  . Social connections  Talks on phone: Not on file    Gets together: Not on file    Attends religious service: Not on file    Active member of club or organization: Not on file    Attends meetings of clubs or organizations: Not on file    Relationship status: Not on file  Other Topics Concern  . Not on file  Social History Narrative  . Not on file    Family History  Problem Relation Age of Onset  . Congestive Heart Failure Maternal Grandmother   . Stroke Maternal Grandfather   . Cancer Father     Medications:       Current Outpatient Medications:  .  amLODipine (NORVASC) 10 MG tablet, Take 10 mg by mouth daily. , Disp: , Rfl:  .  ibuprofen (ADVIL) 600 MG tablet, Take 1 tablet (600 mg total) by mouth every 6 (six) hours as needed., Disp: 60 tablet, Rfl: 3 .  UNABLE TO FIND, Antibiotic-unsure of name-BID, Disp: , Rfl:  .  megestrol (MEGACE) 40 MG tablet, 3 tablets a day for 5 days, 2 tablets a day for 5 days then 1 tablet  daily, Disp: 45 tablet, Rfl: 3  Objective Height 5\' 4"  (1.626 m), weight 209 lb (94.8 kg), last menstrual period 03/06/2019.  Gen WDWN NAD  Pertinent ROS No burning with urination, frequency or urgency No nausea, vomiting or diarrhea Nor fever chills or other constitutional symptoms   Labs or studies Reviewed all from recent 4 day hospitalization    Impression Diagnoses this Encounter::   ICD-10-CM   1. Ovarian mass, right  N83.8 CA 125  2. Menometrorrhagia  N92.1 CA 125  3. Dysmenorrhea  N94.6 CA 125  4. Dyspareunia, female  N94.10   5. PID (acute pelvic inflammatory disease)  N73.0     Established relevant diagnosis(es):   Plan/Recommendations: Meds ordered this encounter  Medications  . megestrol (MEGACE) 40 MG tablet    Sig: 3 tablets a day for 5 days, 2 tablets a day for 5 days then 1 tablet daily    Dispense:  45 tablet    Refill:  3    Labs or Scans Ordered: Orders Placed This Encounter  Procedures  . CA 125    Management:: Due to patients significant anemia due to menorrhagia, her severe dysmenorrhea and her painful complex ovarian cyst, 75% dysparunia, I am recommending a mini lap TAH BSO for definitive management of all issues,ablation would not be appropriate in light of dyspareunia.  As a result cervix will also need to be removed due to that.  CA 125 is pending.    Megace to stabilize bleeding and hopefully increase her hemoglobin pre operatively.  Will schedule for 03/19/2019.    Follow up Return in about 25 days (around 03/31/2019) for [place], with Dr Elonda Husky.        Face to face time:  20 minutes  Greater than 50% of the visit time was spent in counseling and coordination of care with the patient.  The summary and outline of the counseling and care coordination is summarized in the note above.   All questions were answered.

## 2019-03-07 LAB — CA 125: Cancer Antigen (CA) 125: 55.6 U/mL — ABNORMAL HIGH (ref 0.0–38.1)

## 2019-03-13 NOTE — Patient Instructions (Signed)
Sabrina Spence  03/13/2019     @PREFPERIOPPHARMACY @   Your procedure is scheduled on  03/19/2019.  Report to [facility] at  1015  A.M.  Call this number if you have problems the morning of surgery:  [phone]   Remember:  Do not eat or drink after midnight.                        Take these medicines the morning of surgery with A SIP OF WATER  amlodipine    Do not wear jewelry, make-up or nail polish.  Do not wear lotions, powders, or perfumes. Please wear deodorant and brush your teeth.  Do not shave 48 hours prior to surgery.  Men may shave face and neck.  Do not bring valuables to the hospital.  [facility] is not responsible for any belongings or valuables.  Contacts, dentures or bridgework may not be worn into surgery.  Leave your suitcase in the car.  After surgery it may be brought to your room.  For patients admitted to the hospital, discharge time will be determined by your treatment team.  Patients discharged the day of surgery will not be allowed to drive home.   Name and phone number of your driver:   family Special instructions:  none  Please read over the following fact sheets that you were given. Pain Booklet, Coughing and Deep Breathing, Surgical Site Infection Prevention, Anesthesia Post-op Instructions and Care and Recovery After Surgery       Bilateral Salpingo-Oophorectomy, Care After This sheet gives you information about how to care for yourself after your procedure. Your health care provider may also give you more specific instructions. If you have problems or questions, contact your health care provider. What can I expect after the procedure? After the procedure, it is common to have:  Abdominal pain.  Some occasional vaginal bleeding (spotting).  Tiredness.  Symptoms of menopause, such as hot flashes, night sweats, or mood swings. Follow these instructions at home: Incision care   Keep your incision area and your  bandage (dressing) clean and dry.  Follow instructions from your health care provider about how to take care of your incision. Make sure you: ? Wash your hands with soap and water before you change your dressing. If soap and water are not available, use hand sanitizer. ? Change your dressing as told by your health care provider. ? Leave stitches (sutures), staples, skin glue, or adhesive strips in place. These skin closures may need to stay in place for 2 weeks or longer. If adhesive strip edges start to loosen and curl up, you may trim the loose edges. Do not remove adhesive strips completely unless your health care provider tells you to do that.  Check your incision area every day for signs of infection. Check for: ? Redness, swelling, or pain. ? Fluid or blood. ? Warmth. ? Pus or a bad smell. Activity   Do not drive or use heavy machinery while taking prescription pain medicine.  Do not drive for 24 hours if you received a medicine to help you relax (sedative) during your procedure.  Take frequent, short walks throughout the day. Rest when you get tired. Ask your health care provider what activities are safe for you.  Avoid activity that requires great effort. Also, avoid heavy lifting. Do not lift anything that is heavier than 10 lbs. (4.5 kg), or the limit that your  health care provider tells you, until he or she says that it is safe to do so.  Do not douche, use tampons, or have sex until your health care provider approves. General instructions   To prevent or treat constipation while you are taking prescription pain medicine, your health care provider may recommend that you: ? Drink enough fluid to keep your urine clear or pale yellow. ? Take over-the-counter or prescription medicines. ? Eat foods that are high in fiber, such as fresh fruits and vegetables, whole grains, and beans. ? Limit foods that are high in fat and processed sugars, such as fried and sweet foods.  Take  over-the-counter and prescription medicines only as told by your health care provider.  Do not take baths, swim, or use a hot tub until your health care provider approves. Ask your health care provider if you can take showers. You may only be allowed to take sponge baths for bathing.  Wear compression stockings as told by your health care provider. These stockings help to prevent blood clots and reduce swelling in your legs.  Keep all follow-up visits as told by your health care provider. This is important. Contact a health care provider if:  You have pain when you urinate.  You have pus or a bad smelling discharge coming from your vagina.  You have redness, swelling, or pain around your incision.  You have fluid or blood coming from your incision.  Your incision feels warm to the touch.  You have pus or a bad smell coming from your incision.  You have a fever.  Your incision starts to break open.  You have pain in the abdomen, and it gets worse or does not get better when you take medicine.  You develop a rash.  You develop nausea and vomiting.  You feel lightheaded. Get help right away if:  You develop pain in your chest or leg.  You become short of breath.  You faint.  You have increased bleeding from your vagina. Summary  After the procedure, it is common to have pain, bleeding in the vagina, and symptoms of menopause.  Follow instructions from your health care provider about how to take care of your incision.  Follow instructions from your health care provider about activities and restrictions.  Check your incision every day for signs of infection and report any symptoms to your health care provider. This information is not intended to replace advice given to you by your health care provider. Make sure you discuss any questions you have with your health care provider. Document Released: 05/22/2005 Document Revised: 07/26/2018 Document Reviewed: 06/26/2016  Elsevier Patient Education  2020 [place].  Abdominal Hysterectomy, Care After This sheet gives you information about how to care for yourself after your procedure. Your health care provider may also give you more specific instructions. If you have problems or questions, contact your health care provider. What can I expect after the procedure? After the procedure, it is common to have:  Pain.  Tiredness (fatigue).  Poor appetite.  Lowered interest in sex.  Bleeding and discharge from your vagina. You may need to use a pad in your underwear after this procedure. Follow these instructions at home: Medicines  Take over-the-counter and prescription medicines only as told by your doctor.  Do not take aspirin or ibuprofen. These medicines can cause bleeding.  Ask your doctor if the medicine prescribed to you: ? Requires you to avoid driving or using heavy machinery. ? Can cause trouble  pooping (constipation). You may need to take these actions to prevent or treat trouble pooping:  Take over-the-counter or prescription medicines.  Eat foods that are high in fiber. These include beans, whole grains, and fresh fruits and vegetables.  Limit foods that are high in fat and processed sugars. These include fried or sweet foods. Surgical cut (incision) care      Follow instructions from your doctor about how to take care of your cut from surgery (incision). Make sure you: ? Wash your hands with soap and water before and after you change your bandage (dressing). If you cannot use soap and water, use hand sanitizer. ? Change your bandage as told by your doctor. ? Leave stitches (sutures), skin glue, or skin tape (adhesive) strips in place. They may need to stay in place for 2 weeks or longer. If tape strips get loose and curl up, you may trim the loose edges. Do not remove tape strips completely unless your doctor says it is okay.  Check your cut from surgery every day for signs of  infection. Check for: ? Redness, swelling, or pain. ? Fluid or blood. ? Warmth. ? Pus or a bad smell. Activity  Rest as told by your doctor. ? Do not sit for a long time without moving. Get up to take short walks every 1-2 hours. This is important. Ask for help if you feel weak or unsteady.  Do not lift anything that is heavier than 10 lb (4.5 kg), or the limit that you are told, until your doctor says that it is safe.  Do not drive or use heavy machinery while taking prescription pain medicine.  Follow your doctor's advice about exercise, driving, and general activities. Return to your normal activities as told by your doctor. Ask your doctor what activities are safe for you. Lifestyle  Do not douche, use tampons, or have sex for at least 6 weeks or as told by your doctor.  Do not drink alcohol until your doctor says it is okay.  Do not use any products that contain nicotine or tobacco, such as cigarettes, e-cigarettes, and chewing tobacco. If you need help quitting, ask your doctor. General instructions   Drink enough fluid to keep your pee (urine) pale yellow.  Do not take baths, swim, or use a hot tub until your doctor approves. Ask your doctor if you may take showers. You may only be allowed to take sponge baths.  Try to have someone at home with you for the first 1-2 weeks to help you with your daily chores at home.  Keep the bandage dry until your doctor says it can be taken off.  Wear tight-fitting (compression) stockings as told by your health care provider. These stockings help to prevent blood clots and reduce swelling in your legs.  Keep all follow-up visits as told by your doctor. This is important. Contact a doctor if:  You have any of these signs of infection: ? Redness, swelling, or pain around your cut. ? Fluid or blood coming from your cut. ? Warmth coming from your cut. ? Pus or a bad smell coming from your cut. ? Chills or a fever.  Your cut breaks  open.  You feel dizzy or light-headed.  You have pain or bleeding when you pee.  You keep having watery poop (diarrhea).  You keep feeling like you may vomit (nauseous) or keep vomiting.  You have unusual fluid (discharge) coming from your vagina.  You have a rash.  You  have any type of reaction to your medicine that is not normal, or you develop an allergy to your medicine.  Your pain medicine does not help. Get help right away if:  You have a fever and your symptoms get worse all of a sudden.  You have very bad pain in your belly (abdomen).  You are short of breath.  You faint.  You have pain, swelling, or redness of your leg.  You bleed a lot from your vagina and notice clumps of blood (clots). Summary  Do not take baths, swim, or use a hot tub until your doctor approves. Ask your doctor if you may take showers. You may only be allowed to take sponge baths.  Do not lift anything that is heavier than 10 lb (4.5 kg), or the limit that you are told, until your doctor says that it is safe.  Follow your doctor's advice about exercise, driving, and general activities. Ask your doctor what activities are safe for you.  Try to have someone at home with you for the first 1-2 weeks to help you with your daily chores at home. This information is not intended to replace advice given to you by your health care provider. Make sure you discuss any questions you have with your health care provider. Document Released: 02/29/2008 Document Revised: 07/25/2018 Document Reviewed: 05/10/2016 Elsevier Patient Education  2020 [place] Anesthesia, Adult, Care After This sheet gives you information about how to care for yourself after your procedure. Your health care provider may also give you more specific instructions. If you have problems or questions, contact your health care provider. What can I expect after the procedure? After the procedure, the following side effects are  common:  Pain or discomfort at the IV site.  Nausea.  Vomiting.  Sore throat.  Trouble concentrating.  Feeling cold or chills.  Weak or tired.  Sleepiness and fatigue.  Soreness and body aches. These side effects can affect parts of the body that were not involved in surgery. Follow these instructions at home:  For at least 24 hours after the procedure:  Have a responsible adult stay with you. It is important to have someone help care for you until you are awake and alert.  Rest as needed.  Do not: ? Participate in activities in which you could fall or become injured. ? Drive. ? Use heavy machinery. ? Drink alcohol. ? Take sleeping pills or medicines that cause drowsiness. ? Make important decisions or sign legal documents. ? Take care of children on your own. Eating and drinking  Follow any instructions from your health care provider about eating or drinking restrictions.  When you feel hungry, start by eating small amounts of foods that are soft and easy to digest (bland), such as toast. Gradually return to your regular diet.  Drink enough fluid to keep your urine pale yellow.  If you vomit, rehydrate by drinking water, juice, or clear broth. General instructions  If you have sleep apnea, surgery and certain medicines can increase your risk for breathing problems. Follow instructions from your health care provider about wearing your sleep device: ? Anytime you are sleeping, including during daytime naps. ? While taking prescription pain medicines, sleeping medicines, or medicines that make you drowsy.  Return to your normal activities as told by your health care provider. Ask your health care provider what activities are safe for you.  Take over-the-counter and prescription medicines only as told by your health care provider.  If you smoke, do not smoke without supervision.  Keep all follow-up visits as told by your health care provider. This is important.  Contact a health care provider if:  You have nausea or vomiting that does not get better with medicine.  You cannot eat or drink without vomiting.  You have pain that does not get better with medicine.  You are unable to pass urine.  You develop a skin rash.  You have a fever.  You have redness around your IV site that gets worse. Get help right away if:  You have difficulty breathing.  You have chest pain.  You have blood in your urine or stool, or you vomit blood. Summary  After the procedure, it is common to have a sore throat or nausea. It is also common to feel tired.  Have a responsible adult stay with you for the first 24 hours after general anesthesia. It is important to have someone help care for you until you are awake and alert.  When you feel hungry, start by eating small amounts of foods that are soft and easy to digest (bland), such as toast. Gradually return to your regular diet.  Drink enough fluid to keep your urine pale yellow.  Return to your normal activities as told by your health care provider. Ask your health care provider what activities are safe for you. This information is not intended to replace advice given to you by your health care provider. Make sure you discuss any questions you have with your health care provider. Document Released: 08/28/2000 Document Revised: 05/25/2017 Document Reviewed: 01/05/2017 Elsevier Patient Education  2020 [employer]. How to Use Chlorhexidine for Bathing Chlorhexidine gluconate (CHG) is a germ-killing (antiseptic) solution that is used to clean the skin. It can get rid of the bacteria that normally live on the skin and can keep them away for about 24 hours. To clean your skin with CHG, you may be given:  A CHG solution to use in the shower or as part of a sponge bath.  A prepackaged cloth that contains CHG. Cleaning your skin with CHG may help lower the risk for infection:  While you are staying in the  intensive care unit of the hospital.  If you have a vascular access, such as a central line, to provide short-term or long-term access to your veins.  If you have a catheter to drain urine from your bladder.  If you are on a ventilator. A ventilator is a machine that helps you breathe by moving air in and out of your lungs.  After surgery. What are the risks? Risks of using CHG include:  A skin reaction.  Hearing loss, if CHG gets in your ears.  Eye injury, if CHG gets in your eyes and is not rinsed out.  The CHG product catching fire. Make sure that you avoid smoking and flames after applying CHG to your skin. Do not use CHG:  If you have a chlorhexidine allergy or have previously reacted to chlorhexidine.  On babies younger than 2 months of age. How to use CHG solution  Use CHG only as told by your health care provider, and follow the instructions on the label.  Use the full amount of CHG as directed. Usually, this is one bottle. During a shower Follow these steps when using CHG solution during a shower (unless your health care provider gives you different instructions): 1. Start the shower. 2. Use your normal soap and shampoo to wash your face  and hair. 3. Turn off the shower or move out of the shower stream. 4. Pour the CHG onto a clean washcloth. Do not use any type of brush or rough-edged sponge. 5. Starting at your neck, lather your body down to your toes. Make sure you follow these instructions: ? If you will be having surgery, pay special attention to the part of your body where you will be having surgery. Scrub this area for at least 1 minute. ? Do not use CHG on your head or face. If the solution gets into your ears or eyes, rinse them well with water. ? Avoid your genital area. ? Avoid any areas of skin that have broken skin, cuts, or scrapes. ? Scrub your back and under your arms. Make sure to wash skin folds. 6. Let the lather sit on your skin for 1-2 minutes  or as long as told by your health care provider. 7. Thoroughly rinse your entire body in the shower. Make sure that all body creases and crevices are rinsed well. 8. Dry off with a clean towel. Do not put any substances on your body afterward-such as powder, lotion, or perfume-unless you are told to do so by your health care provider. Only use lotions that are recommended by the manufacturer. 9. Put on clean clothes or pajamas. 10. If it is the night before your surgery, sleep in clean sheets.  During a sponge bath Follow these steps when using CHG solution during a sponge bath (unless your health care provider gives you different instructions): 1. Use your normal soap and shampoo to wash your face and hair. 2. Pour the CHG onto a clean washcloth. 3. Starting at your neck, lather your body down to your toes. Make sure you follow these instructions: ? If you will be having surgery, pay special attention to the part of your body where you will be having surgery. Scrub this area for at least 1 minute. ? Do not use CHG on your head or face. If the solution gets into your ears or eyes, rinse them well with water. ? Avoid your genital area. ? Avoid any areas of skin that have broken skin, cuts, or scrapes. ? Scrub your back and under your arms. Make sure to wash skin folds. 4. Let the lather sit on your skin for 1-2 minutes or as long as told by your health care provider. 5. Using a different clean, wet washcloth, thoroughly rinse your entire body. Make sure that all body creases and crevices are rinsed well. 6. Dry off with a clean towel. Do not put any substances on your body afterward-such as powder, lotion, or perfume-unless you are told to do so by your health care provider. Only use lotions that are recommended by the manufacturer. 7. Put on clean clothes or pajamas. 8. If it is the night before your surgery, sleep in clean sheets. How to use CHG prepackaged cloths  Only use CHG cloths as told  by your health care provider, and follow the instructions on the label.  Use the CHG cloth on clean, dry skin.  Do not use the CHG cloth on your head or face unless your health care provider tells you to.  When washing with the CHG cloth: ? Avoid your genital area. ? Avoid any areas of skin that have broken skin, cuts, or scrapes. Before surgery Follow these steps when using a CHG cloth to clean before surgery (unless your health care provider gives you different instructions): 1. Using the  CHG cloth, vigorously scrub the part of your body where you will be having surgery. Scrub using a back-and-forth motion for 3 minutes. The area on your body should be completely wet with CHG when you are done scrubbing. 2. Do not rinse. Discard the cloth and let the area air-dry. Do not put any substances on the area afterward, such as powder, lotion, or perfume. 3. Put on clean clothes or pajamas. 4. If it is the night before your surgery, sleep in clean sheets.  For general bathing Follow these steps when using CHG cloths for general bathing (unless your health care provider gives you different instructions). 1. Use a separate CHG cloth for each area of your body. Make sure you wash between any folds of skin and between your fingers and toes. Wash your body in the following order, switching to a new cloth after each step: ? The front of your neck, shoulders, and chest. ? Both of your arms, under your arms, and your hands. ? Your stomach and groin area, avoiding the genitals. ? Your right leg and foot. ? Your left leg and foot. ? The back of your neck, your back, and your buttocks. 2. Do not rinse. Discard the cloth and let the area air-dry. Do not put any substances on your body afterward-such as powder, lotion, or perfume-unless you are told to do so by your health care provider. Only use lotions that are recommended by the manufacturer. 3. Put on clean clothes or pajamas. Contact a health care  provider if:  Your skin gets irritated after scrubbing.  You have questions about using your solution or cloth. Get help right away if:  Your eyes become very red or swollen.  Your eyes itch badly.  Your skin itches badly and is red or swollen.  Your hearing changes.  You have trouble seeing.  You have swelling or tingling in your mouth or throat.  You have trouble breathing.  You swallow any chlorhexidine. Summary  Chlorhexidine gluconate (CHG) is a germ-killing (antiseptic) solution that is used to clean the skin. Cleaning your skin with CHG may help to lower your risk for infection.  You may be given CHG to use for bathing. It may be in a bottle or in a prepackaged cloth to use on your skin. Carefully follow your health care provider's instructions and the instructions on the product label.  Do not use CHG if you have a chlorhexidine allergy.  Contact your health care provider if your skin gets irritated after scrubbing. This information is not intended to replace advice given to you by your health care provider. Make sure you discuss any questions you have with your health care provider. Document Released: 02/14/2012 Document Revised: 08/08/2018 Document Reviewed: 04/19/2017 Elsevier Patient Education  2020 [employer].

## 2019-03-14 ENCOUNTER — Other Ambulatory Visit: Payer: Self-pay | Admitting: Obstetrics & Gynecology

## 2019-03-17 ENCOUNTER — Other Ambulatory Visit (HOSPITAL_COMMUNITY)
Admission: RE | Admit: 2019-03-17 | Discharge: 2019-03-17 | Disposition: A | Discharge status: Home / Self Care | Payer: 59 | Source: Ambulatory Visit | Attending: Obstetrics & Gynecology | Admitting: Obstetrics & Gynecology

## 2019-03-17 ENCOUNTER — Other Ambulatory Visit: Payer: Self-pay

## 2019-03-17 ENCOUNTER — Encounter (HOSPITAL_COMMUNITY)
Admission: RE | Admit: 2019-03-17 | Discharge: 2019-03-17 | Disposition: A | Discharge status: Home / Self Care | Payer: 59 | Source: Ambulatory Visit | Attending: Obstetrics & Gynecology | Admitting: Obstetrics & Gynecology

## 2019-03-17 ENCOUNTER — Encounter (HOSPITAL_COMMUNITY): Payer: Self-pay

## 2019-03-17 DIAGNOSIS — Z20828 Contact with and (suspected) exposure to other viral communicable diseases: Secondary | ICD-10-CM | POA: Insufficient documentation

## 2019-03-17 DIAGNOSIS — Z01812 Encounter for preprocedural laboratory examination: Secondary | ICD-10-CM | POA: Insufficient documentation

## 2019-03-17 LAB — COMPREHENSIVE METABOLIC PANEL
ALT: 22 U/L (ref 0–44)
AST: 26 U/L (ref 15–41)
Albumin: 3.3 g/dL — ABNORMAL LOW (ref 3.5–5.0)
Alkaline Phosphatase: 56 U/L (ref 38–126)
Anion gap: 11 (ref 5–15)
BUN: 9 mg/dL (ref 6–20)
CO2: 21 mmol/L — ABNORMAL LOW (ref 22–32)
Calcium: 9.3 mg/dL (ref 8.9–10.3)
Chloride: 105 mmol/L (ref 98–111)
Creatinine, Ser: 0.86 mg/dL (ref 0.44–1.00)
GFR calc Af Amer: >60 mL/min (ref >60)
GFR calc non Af Amer: >60 mL/min (ref >60)
Glucose, Bld: 166 mg/dL — ABNORMAL HIGH (ref 70–99)
Potassium: 3.6 mmol/L (ref 3.5–5.1)
Sodium: 137 mmol/L (ref 135–145)
Total Bilirubin: 0.5 mg/dL (ref 0.3–1.2)
Total Protein: 7.8 g/dL (ref 6.5–8.1)

## 2019-03-17 LAB — URINALYSIS, ROUTINE W REFLEX MICROSCOPIC
Bilirubin Urine: NEGATIVE
Glucose, UA: NEGATIVE mg/dL
Hgb urine dipstick: NEGATIVE
Ketones, ur: NEGATIVE mg/dL
Leukocytes,Ua: NEGATIVE
Nitrite: NEGATIVE
Protein, ur: NEGATIVE mg/dL
Specific Gravity, Urine: 1.018 (ref 1.005–1.030)
pH: 5 (ref 5.0–8.0)

## 2019-03-17 LAB — TYPE AND SCREEN
ABO/RH(D): O POS
Antibody Screen: NEGATIVE

## 2019-03-17 LAB — HCG, QUANTITATIVE, PREGNANCY: hCG, Beta Chain, Quant, S: 2 m[IU]/mL (ref <5)

## 2019-03-17 LAB — CBC
HCT: 32.6 % — ABNORMAL LOW (ref 36.0–46.0)
Hemoglobin: 10.3 g/dL — ABNORMAL LOW (ref 12.0–15.0)
MCH: 27.8 pg (ref 26.0–34.0)
MCHC: 31.6 g/dL (ref 30.0–36.0)
MCV: 88.1 fL (ref 80.0–100.0)
Platelets: 363 10*3/uL (ref 150–400)
RBC: 3.7 MIL/uL — ABNORMAL LOW (ref 3.87–5.11)
RDW: 19.1 % — ABNORMAL HIGH (ref 11.5–15.5)
WBC: 6.1 10*3/uL (ref 4.0–10.5)
nRBC: 0 % (ref 0.0–0.2)

## 2019-03-17 LAB — RAPID HIV SCREEN (HIV 1/2 AB+AG)
HIV 1/2 Antibodies: NONREACTIVE
HIV-1 P24 Antigen - HIV24: NONREACTIVE

## 2019-03-17 LAB — SARS CORONAVIRUS 2 (TAT 6-24 HRS): SARS Coronavirus 2: NEGATIVE

## 2019-03-19 ENCOUNTER — Inpatient Hospital Stay (HOSPITAL_COMMUNITY)
Admission: RE | Admit: 2019-03-19 | Discharge: 2019-03-22 | DRG: 743 | Disposition: A | Discharge status: Home / Self Care | Payer: 59 | Attending: Obstetrics & Gynecology | Admitting: Obstetrics & Gynecology

## 2019-03-19 ENCOUNTER — Other Ambulatory Visit: Payer: Self-pay

## 2019-03-19 ENCOUNTER — Encounter (HOSPITAL_COMMUNITY)
Admission: RE | Disposition: A | Discharge status: Home / Self Care | Payer: Self-pay | Source: Home / Self Care | Attending: Obstetrics & Gynecology

## 2019-03-19 ENCOUNTER — Inpatient Hospital Stay (HOSPITAL_COMMUNITY): Payer: 59 | Admitting: Anesthesiology

## 2019-03-19 ENCOUNTER — Encounter (HOSPITAL_COMMUNITY): Payer: Self-pay | Admitting: *Deleted

## 2019-03-19 DIAGNOSIS — N946 Dysmenorrhea, unspecified: Secondary | ICD-10-CM | POA: Diagnosis present

## 2019-03-19 DIAGNOSIS — E860 Dehydration: Secondary | ICD-10-CM | POA: Diagnosis present

## 2019-03-19 DIAGNOSIS — D259 Leiomyoma of uterus, unspecified: Secondary | ICD-10-CM | POA: Diagnosis not present

## 2019-03-19 DIAGNOSIS — Z823 Family history of stroke: Secondary | ICD-10-CM

## 2019-03-19 DIAGNOSIS — N941 Unspecified dyspareunia: Secondary | ICD-10-CM | POA: Diagnosis present

## 2019-03-19 DIAGNOSIS — Z8249 Family history of ischemic heart disease and other diseases of the circulatory system: Secondary | ICD-10-CM

## 2019-03-19 DIAGNOSIS — N730 Acute parametritis and pelvic cellulitis: Secondary | ICD-10-CM | POA: Diagnosis present

## 2019-03-19 DIAGNOSIS — N840 Polyp of corpus uteri: Secondary | ICD-10-CM | POA: Diagnosis not present

## 2019-03-19 DIAGNOSIS — N7003 Acute salpingitis and oophoritis: Secondary | ICD-10-CM | POA: Diagnosis present

## 2019-03-19 DIAGNOSIS — N921 Excessive and frequent menstruation with irregular cycle: Secondary | ICD-10-CM | POA: Diagnosis present

## 2019-03-19 DIAGNOSIS — K660 Peritoneal adhesions (postprocedural) (postinfection): Secondary | ICD-10-CM | POA: Diagnosis present

## 2019-03-19 DIAGNOSIS — N838 Other noninflammatory disorders of ovary, fallopian tube and broad ligament: Secondary | ICD-10-CM | POA: Diagnosis not present

## 2019-03-19 DIAGNOSIS — N839 Noninflammatory disorder of ovary, fallopian tube and broad ligament, unspecified: Secondary | ICD-10-CM | POA: Diagnosis present

## 2019-03-19 DIAGNOSIS — D649 Anemia, unspecified: Secondary | ICD-10-CM | POA: Diagnosis present

## 2019-03-19 DIAGNOSIS — Z90710 Acquired absence of both cervix and uterus: Secondary | ICD-10-CM | POA: Diagnosis present

## 2019-03-19 DIAGNOSIS — N7013 Chronic salpingitis and oophoritis: Secondary | ICD-10-CM | POA: Diagnosis present

## 2019-03-19 DIAGNOSIS — Z20828 Contact with and (suspected) exposure to other viral communicable diseases: Secondary | ICD-10-CM | POA: Diagnosis present

## 2019-03-19 HISTORY — PX: SUPRACERVICAL ABDOMINAL HYSTERECTOMY: SHX5393

## 2019-03-19 HISTORY — PX: SALPINGOOPHORECTOMY: SHX82

## 2019-03-19 SURGERY — HYSTERECTOMY, SUPRACERVICAL, ABDOMINAL
Anesthesia: General | Site: Abdomen

## 2019-03-19 MED ORDER — ENOXAPARIN SODIUM 40 MG/0.4ML ~~LOC~~ SOLN
40.0000 mg | SUBCUTANEOUS | Status: DC
  Administered 2019-03-21 – 2019-03-22 (×2): 40 mg via SUBCUTANEOUS
  Filled 2019-03-19 (×2): qty 0.4

## 2019-03-19 MED ORDER — HYDROMORPHONE HCL 1 MG/ML IJ SOLN
0.2500 mg | INTRAMUSCULAR | Status: DC | PRN
  Administered 2019-03-19 (×2): 0.5 mg via INTRAVENOUS
  Filled 2019-03-19 (×2): qty 0.5

## 2019-03-19 MED ORDER — PIPERACILLIN-TAZOBACTAM 3.375 G IVPB
3.3750 g | Freq: Three times a day (TID) | INTRAVENOUS | Status: AC
  Administered 2019-03-19 – 2019-03-21 (×6): 3.375 g via INTRAVENOUS
  Filled 2019-03-19 (×6): qty 50

## 2019-03-19 MED ORDER — HEMOSTATIC AGENTS (NO CHARGE) OPTIME
TOPICAL | Status: DC | PRN
  Administered 2019-03-19: 2 via TOPICAL

## 2019-03-19 MED ORDER — 0.9 % SODIUM CHLORIDE (POUR BTL) OPTIME
TOPICAL | Status: DC | PRN
  Administered 2019-03-19: 2000 mL

## 2019-03-19 MED ORDER — LACTATED RINGERS IV SOLN
INTRAVENOUS | Status: DC
  Administered 2019-03-19: 10:00:00 1000 mL via INTRAVENOUS

## 2019-03-19 MED ORDER — BUPIVACAINE LIPOSOME 1.3 % IJ SUSP
INTRAMUSCULAR | Status: AC
  Filled 2019-03-19: qty 20

## 2019-03-19 MED ORDER — PROMETHAZINE HCL 25 MG/ML IJ SOLN
25.0000 mg | Freq: Four times a day (QID) | INTRAMUSCULAR | Status: DC | PRN
  Administered 2019-03-19 – 2019-03-20 (×2): 25 mg via INTRAVENOUS
  Filled 2019-03-19 (×2): qty 1

## 2019-03-19 MED ORDER — ESMOLOL HCL 100 MG/10ML IV SOLN
INTRAVENOUS | Status: AC
  Filled 2019-03-19: qty 20

## 2019-03-19 MED ORDER — ESMOLOL HCL 100 MG/10ML IV SOLN
INTRAVENOUS | Status: DC | PRN
  Administered 2019-03-19: 20 mg via INTRAVENOUS

## 2019-03-19 MED ORDER — FENTANYL CITRATE (PF) 100 MCG/2ML IJ SOLN
INTRAMUSCULAR | Status: DC | PRN
  Administered 2019-03-19: 100 ug via INTRAVENOUS
  Administered 2019-03-19: 50 ug via INTRAVENOUS
  Administered 2019-03-19: 100 ug via INTRAVENOUS
  Administered 2019-03-19 (×2): 50 ug via INTRAVENOUS

## 2019-03-19 MED ORDER — FENTANYL CITRATE (PF) 100 MCG/2ML IJ SOLN: 50.0000 ug | INTRAMUSCULAR | Status: DC | PRN

## 2019-03-19 MED ORDER — PHENYLEPHRINE 40 MCG/ML (10ML) SYRINGE FOR IV PUSH (FOR BLOOD PRESSURE SUPPORT)
PREFILLED_SYRINGE | INTRAVENOUS | Status: AC
  Filled 2019-03-19: qty 10

## 2019-03-19 MED ORDER — BUPIVACAINE LIPOSOME 1.3 % IJ SUSP
INTRAMUSCULAR | Status: DC | PRN
  Administered 2019-03-19: 20 mL

## 2019-03-19 MED ORDER — ALUM & MAG HYDROXIDE-SIMETH 200-200-20 MG/5ML PO SUSP
30.0000 mL | ORAL | Status: DC | PRN
  Administered 2019-03-20: 30 mL via ORAL
  Filled 2019-03-19: qty 30

## 2019-03-19 MED ORDER — DEXTROSE 5 % IV SOLN: INTRAVENOUS | Status: DC | PRN

## 2019-03-19 MED ORDER — PROMETHAZINE HCL 25 MG/ML IJ SOLN: 6.2500 mg | INTRAMUSCULAR | Status: DC | PRN

## 2019-03-19 MED ORDER — ONDANSETRON HCL 4 MG PO TABS
8.0000 mg | ORAL_TABLET | Freq: Four times a day (QID) | ORAL | Status: DC | PRN
  Administered 2019-03-20 – 2019-03-21 (×3): 8 mg via ORAL
  Filled 2019-03-19 (×3): qty 2

## 2019-03-19 MED ORDER — LABETALOL HCL 5 MG/ML IV SOLN
INTRAVENOUS | Status: AC
  Filled 2019-03-19: qty 4

## 2019-03-19 MED ORDER — IBUPROFEN 800 MG PO TABS: 800.0000 mg | ORAL_TABLET | Freq: Four times a day (QID) | ORAL | Status: DC

## 2019-03-19 MED ORDER — DIPHENHYDRAMINE HCL 50 MG/ML IJ SOLN: 25.0000 mg | Freq: Four times a day (QID) | INTRAMUSCULAR | Status: DC | PRN

## 2019-03-19 MED ORDER — CLINDAMYCIN PHOSPHATE 600 MG/50ML IV SOLN: INTRAVENOUS | Status: DC | PRN

## 2019-03-19 MED ORDER — OXYCODONE HCL 5 MG PO TABS
5.0000 mg | ORAL_TABLET | ORAL | Status: DC | PRN
  Administered 2019-03-19 – 2019-03-22 (×7): 5 mg via ORAL
  Filled 2019-03-19: qty 2
  Filled 2019-03-19 (×6): qty 1

## 2019-03-19 MED ORDER — MIDAZOLAM HCL 2 MG/2ML IJ SOLN
INTRAMUSCULAR | Status: AC
  Filled 2019-03-19: qty 2

## 2019-03-19 MED ORDER — ROCURONIUM BROMIDE 10 MG/ML (PF) SYRINGE
PREFILLED_SYRINGE | INTRAVENOUS | Status: AC
  Filled 2019-03-19: qty 10

## 2019-03-19 MED ORDER — GABAPENTIN 100 MG PO CAPS
100.0000 mg | ORAL_CAPSULE | Freq: Three times a day (TID) | ORAL | Status: DC
  Administered 2019-03-19 – 2019-03-22 (×8): 100 mg via ORAL
  Filled 2019-03-19 (×12): qty 1

## 2019-03-19 MED ORDER — GENTAMICIN SULFATE 40 MG/ML IJ SOLN
Freq: Once | INTRAVENOUS | Status: DC
  Filled 2019-03-19: qty 100

## 2019-03-19 MED ORDER — ZOLPIDEM TARTRATE 10 MG PO TABS: 10.0000 mg | ORAL_TABLET | Freq: Every evening | ORAL | Status: DC | PRN

## 2019-03-19 MED ORDER — BUPIVACAINE LIPOSOME 1.3 % IJ SUSP
20.0000 mL | Freq: Once | INTRAMUSCULAR | Status: DC
  Filled 2019-03-19: qty 20

## 2019-03-19 MED ORDER — DOCUSATE SODIUM 100 MG PO CAPS
100.0000 mg | ORAL_CAPSULE | Freq: Two times a day (BID) | ORAL | Status: DC
  Administered 2019-03-19 – 2019-03-22 (×6): 100 mg via ORAL
  Filled 2019-03-19 (×7): qty 1

## 2019-03-19 MED ORDER — GENTAMICIN SULFATE 40 MG/ML IJ SOLN
Freq: Once | INTRAVENOUS | Status: AC
  Administered 2019-03-19: 100 mL via INTRAVENOUS
  Filled 2019-03-19: qty 100

## 2019-03-19 MED ORDER — SUGAMMADEX SODIUM 200 MG/2ML IV SOLN
INTRAVENOUS | Status: DC | PRN
  Administered 2019-03-19: 200 mg via INTRAVENOUS

## 2019-03-19 MED ORDER — KETOROLAC TROMETHAMINE 30 MG/ML IJ SOLN
30.0000 mg | Freq: Once | INTRAMUSCULAR | Status: AC
  Administered 2019-03-19: 30 mg via INTRAVENOUS
  Filled 2019-03-19: qty 1

## 2019-03-19 MED ORDER — FENTANYL CITRATE (PF) 250 MCG/5ML IJ SOLN
INTRAMUSCULAR | Status: AC
  Filled 2019-03-19: qty 5

## 2019-03-19 MED ORDER — ROCURONIUM BROMIDE 100 MG/10ML IV SOLN
INTRAVENOUS | Status: DC | PRN
  Administered 2019-03-19: 20 mg via INTRAVENOUS
  Administered 2019-03-19: 50 mg via INTRAVENOUS

## 2019-03-19 MED ORDER — PROPOFOL 10 MG/ML IV BOLUS
INTRAVENOUS | Status: DC | PRN
  Administered 2019-03-19: 200 mg via INTRAVENOUS

## 2019-03-19 MED ORDER — ZOLPIDEM TARTRATE 5 MG PO TABS: 5.0000 mg | ORAL_TABLET | Freq: Every evening | ORAL | Status: DC | PRN

## 2019-03-19 MED ORDER — MIDAZOLAM HCL 2 MG/2ML IJ SOLN: 0.5000 mg | Freq: Once | INTRAMUSCULAR | Status: DC | PRN

## 2019-03-19 MED ORDER — PHENYLEPHRINE HCL (PRESSORS) 10 MG/ML IV SOLN
INTRAVENOUS | Status: DC | PRN
  Administered 2019-03-19 (×2): 80 ug via INTRAVENOUS

## 2019-03-19 MED ORDER — BISACODYL 10 MG RE SUPP: 10.0000 mg | Freq: Every day | RECTAL | Status: DC | PRN

## 2019-03-19 MED ORDER — FENTANYL CITRATE (PF) 100 MCG/2ML IJ SOLN
INTRAMUSCULAR | Status: AC
  Filled 2019-03-19: qty 2

## 2019-03-19 MED ORDER — PROPOFOL 10 MG/ML IV BOLUS
INTRAVENOUS | Status: AC
  Filled 2019-03-19: qty 20

## 2019-03-19 MED ORDER — KETOROLAC TROMETHAMINE 30 MG/ML IJ SOLN
30.0000 mg | Freq: Four times a day (QID) | INTRAMUSCULAR | Status: DC
  Administered 2019-03-19 – 2019-03-20 (×3): 30 mg via INTRAVENOUS
  Filled 2019-03-19 (×3): qty 1

## 2019-03-19 MED ORDER — AMLODIPINE BESYLATE 5 MG PO TABS
10.0000 mg | ORAL_TABLET | Freq: Every day | ORAL | Status: DC
  Administered 2019-03-20 – 2019-03-22 (×3): 10 mg via ORAL
  Filled 2019-03-19: qty 2
  Filled 2019-03-19: qty 1
  Filled 2019-03-19 (×2): qty 2

## 2019-03-19 MED ORDER — HYDROCODONE-ACETAMINOPHEN 7.5-325 MG PO TABS: 1.0000 | ORAL_TABLET | Freq: Once | ORAL | Status: DC | PRN

## 2019-03-19 MED ORDER — CEFAZOLIN SODIUM-DEXTROSE 2-4 GM/100ML-% IV SOLN
2.0000 g | INTRAVENOUS | Status: AC
  Administered 2019-03-19: 2 g via INTRAVENOUS
  Filled 2019-03-19: qty 100

## 2019-03-19 MED ORDER — SODIUM CHLORIDE 0.9 % IV SOLN
8.0000 mg | Freq: Four times a day (QID) | INTRAVENOUS | Status: DC | PRN
  Filled 2019-03-19: qty 4

## 2019-03-19 MED ORDER — SENNOSIDES-DOCUSATE SODIUM 8.6-50 MG PO TABS: 1.0000 | ORAL_TABLET | Freq: Every evening | ORAL | Status: DC | PRN

## 2019-03-19 MED ORDER — KCL IN DEXTROSE-NACL 20-5-0.45 MEQ/L-%-% IV SOLN
INTRAVENOUS | Status: AC
  Administered 2019-03-19 – 2019-03-21 (×5): via INTRAVENOUS

## 2019-03-19 MED ORDER — PANTOPRAZOLE SODIUM 40 MG PO TBEC: 40.0000 mg | DELAYED_RELEASE_TABLET | Freq: Every day | ORAL | Status: DC | PRN

## 2019-03-19 SURGICAL SUPPLY — 45 items
ADH SKN CLS APL DERMABOND .7 (GAUZE/BANDAGES/DRESSINGS) ×3
APPLIER CLIP 13 LRG OPEN (CLIP) ×4
APR CLP LRG 13 20 CLIP (CLIP) ×3
CLIP APPLIE 13 LRG OPEN (CLIP) ×1 IMPLANT
CLOTH BEACON ORANGE TIMEOUT ST (SAFETY) ×4 IMPLANT
COVER LIGHT HANDLE STERIS (MISCELLANEOUS) ×8 IMPLANT
COVER WAND RF STERILE (DRAPES) ×4 IMPLANT
DERMABOND ADVANCED (GAUZE/BANDAGES/DRESSINGS) ×1
DERMABOND ADVANCED .7 DNX12 (GAUZE/BANDAGES/DRESSINGS) ×3 IMPLANT
DRAPE WARM FLUID 44X44 (DRAPES) ×4 IMPLANT
DRSG OPSITE POSTOP 4X8 (GAUZE/BANDAGES/DRESSINGS) ×4 IMPLANT
ELECT REM PT RETURN 9FT ADLT (ELECTROSURGICAL) ×4
ELECTRODE REM PT RTRN 9FT ADLT (ELECTROSURGICAL) ×3 IMPLANT
GAUZE 4X4 16PLY RFD (DISPOSABLE) ×4 IMPLANT
GLOVE BIO SURGEON STRL SZ7 (GLOVE) ×2 IMPLANT
GLOVE BIOGEL PI IND STRL 7.0 (GLOVE) ×12 IMPLANT
GLOVE BIOGEL PI IND STRL 8 (GLOVE) ×3 IMPLANT
GLOVE BIOGEL PI INDICATOR 7.0 (GLOVE) ×4
GLOVE BIOGEL PI INDICATOR 8 (GLOVE) ×1
GLOVE ECLIPSE 6.5 STRL STRAW (GLOVE) ×2 IMPLANT
GLOVE ECLIPSE 8.0 STRL XLNG CF (GLOVE) ×4 IMPLANT
GOWN STRL REUS W/TWL LRG LVL3 (GOWN DISPOSABLE) ×8 IMPLANT
GOWN STRL REUS W/TWL XL LVL3 (GOWN DISPOSABLE) ×4 IMPLANT
HEMOSTAT ARISTA ABSORB 3G PWDR (HEMOSTASIS) ×4 IMPLANT
INST SET MAJOR GENERAL (KITS) ×4 IMPLANT
KIT BLADEGUARD II DBL (SET/KITS/TRAYS/PACK) ×2 IMPLANT
KIT TURNOVER KIT A (KITS) ×4 IMPLANT
MANIFOLD NEPTUNE II (INSTRUMENTS) ×4 IMPLANT
NDL HYPO 21X1.5 SAFETY (NEEDLE) ×2 IMPLANT
NEEDLE HYPO 21X1.5 SAFETY (NEEDLE) ×4 IMPLANT
NS IRRIG 1000ML POUR BTL (IV SOLUTION) ×8 IMPLANT
PACK ABDOMINAL MAJOR (CUSTOM PROCEDURE TRAY) ×4 IMPLANT
PAD ARMBOARD 7.5X6 YLW CONV (MISCELLANEOUS) ×4 IMPLANT
SET BASIN LINEN APH (SET/KITS/TRAYS/PACK) ×4 IMPLANT
SPONGE LAP 18X18 RF (DISPOSABLE) ×12 IMPLANT
STAPLER VISISTAT 35W (STAPLE) ×2 IMPLANT
SUT CHROMIC 0 CT 1 (SUTURE) ×4 IMPLANT
SUT MON AB 3-0 SH 27 (SUTURE) ×4 IMPLANT
SUT VIC AB 0 CT1 27 (SUTURE) ×12
SUT VIC AB 0 CT1 27XCR 8 STRN (SUTURE) ×7 IMPLANT
SUT VIC AB 0 CTX 36 (SUTURE) ×4
SUT VIC AB 0 CTX36XBRD ANTBCTR (SUTURE) ×3 IMPLANT
SYR 20ML LL LF (SYRINGE) ×6 IMPLANT
TOWEL SURG RFD BLUE STRL DISP (DISPOSABLE) ×4 IMPLANT
TRAY FOLEY MTR SLVR 16FR STAT (SET/KITS/TRAYS/PACK) ×4 IMPLANT

## 2019-03-19 NOTE — Anesthesia Preprocedure Evaluation (Signed)
Anesthesia Evaluation  Patient identified by MRN, date of birth, ID band Patient awake    Reviewed: Allergy & Precautions, NPO status , Patient's Chart, lab work & pertinent test results  Airway Mallampati: II  TM Distance: >3 FB Neck ROM: Full    Dental no notable dental hx. (+) Poor Dentition, Missing   Pulmonary neg pulmonary ROS,    Pulmonary exam normal breath sounds clear to auscultation       Cardiovascular Exercise Tolerance: Good hypertension, Pt. on medications negative cardio ROS Normal cardiovascular examI Rhythm:Regular Rate:Normal     Neuro/Psych negative neurological ROS  negative psych ROS   GI/Hepatic negative GI ROS, Neg liver ROS, GERD meds on chart -denies current GERD meds or Sx  States no GERD meds in over a month    Endo/Other  negative endocrine ROS  Renal/GU negative Renal ROS  negative genitourinary   Musculoskeletal negative musculoskeletal ROS (+)   Abdominal   Peds negative pediatric ROS (+)  Hematology negative hematology ROS (+)   Anesthesia Other Findings   Reproductive/Obstetrics negative OB ROS                             Anesthesia Physical Anesthesia Plan  ASA: II  Anesthesia Plan: General   Post-op Pain Management:    Induction: Intravenous  PONV Risk Score and Plan: 3 and Midazolam, Ondansetron, Dexamethasone and Treatment may vary due to age or medical condition  Airway Management Planned: Oral ETT  Additional Equipment:   Intra-op Plan:   Post-operative Plan: Extubation in OR  Informed Consent: I have reviewed the patients History and Physical, chart, labs and discussed the procedure including the risks, benefits and alternatives for the proposed anesthesia with the patient or authorized representative who has indicated his/her understanding and acceptance.     Dental advisory given  Plan Discussed with: CRNA  Anesthesia Plan  Comments: (Plan Full PPE use Plan GETA D/W PT -WTP with same after Q&A)        Anesthesia Quick Evaluation

## 2019-03-19 NOTE — Transfer of Care (Signed)
Immediate Anesthesia Transfer of Care Note  Patient: Sabrina Spence  Procedure(s) Performed: HYSTERECTOMY SUPRACERVICAL  ABDOMINAL (N/A Abdomen) OPEN SALPINGO OOPHORECTOMY (Bilateral Abdomen)  Patient Location: PACU  Anesthesia Type:General  Level of Consciousness: awake, alert  and patient cooperative  Airway & Oxygen Therapy: Patient Spontanous Breathing  Post-op Assessment: Report given to RN and Post -op Vital signs reviewed and stable  Post vital signs: Reviewed and stable  Last Vitals:  Vitals Value Taken Time  BP 132/85 03/19/19 1400  Temp 98 03/19/2019 1405  Pulse 82 03/19/19 1403  Resp 13 03/19/19 1403  SpO2 93 % 03/19/19 1403  Vitals shown include unvalidated device data.  Last Pain:  Vitals:   03/19/19 0935  TempSrc: Oral  PainSc: 0-No pain         Complications: No apparent anesthesia complications

## 2019-03-19 NOTE — Op Note (Signed)
Preoperative diagnosis:   1. Ovarian mass, right  N83.8 CA 125  2. Menometrorrhagia  N92.1 CA 125  3. Dysmenorrhea  N94.6 CA 125  4. Dyspareunia, female  N94.10   5. PID (acute pelvic inflammatory disease)      Postoperative diagnosis: Chronic right tubo-ovarian abscess with resulting pelvic and abdominal adhesive disease  Procedure: Abdominal supracervical hysterectomy with bilateral salpingo-oophorectomy(was unable to remove entirety of cervix due to frozen cul-de-sac from dense adhesions from chronic tubo-ovarian abscess)  Surgeon: Florian Buff, MD  Anesthesia: General tracheal  Findings: This patient was admitted to the hospital at [facility] on 02/19/2019 for suspected PID and possible right tubo-ovarian abscess having failed outpatient therapy.  Her white blood cell count at the time of admission was 24,000 with a platelet count consistent with an infectious process.  He was treated with Rocephin azithromycin and doxycycline IV and rapidly improved with pain and white blood cell count.  She received 3 days of IV antibiotics and was discharged home to follow-up for me for management of menometrorrhagia dysmenorrhea chronic pelvic pain dyspareunia anemia.  The time I saw her her abdominal exam was benign reviewing the scans the CT showed a complex mass in the right as did the ultrasound but really it was not impressive for tubo-ovarian abscess more like a PID picture with some fat plane disruption but no clear-cut evidence of an abscess.  Her white count was down to 6.1 preoperatively and a normalization of her platelet count and her hemoglobin had increased from 8.2-10.3 with Megace and iron.  As result we felt that this was the appropriate time to go forward with surgical management for her multiple indications.  Today at the time of surgery was obvious she had a right tubo-ovarian abscess with at active purulent fluid in the pelvis and encapsulated in the right tube and ovary.   Cultures were performed  The appendix was otherwise normal but secondarily involved as it was in the neighborhood of the tubo-ovarian abscess in the infectious process.  I had Dr. Constance Haw come in and assess the appendix and she also felt there was not a primary appendiceal process and was indeed secondarily involved but had no indication at this time for removal.  The sigmoid colon was so adherent to the posterior uterine lower uterine segment that time I decided to not remove the cervix because of concern about possibly injuring the sigmoid colon.  Scription of operation:  Patient was taken to the operating room placed in the supine position where she underwent general tracheal anesthesia She was placed in dorsolithotomy position and prepped and draped in usual sterile fashion including vaginal prep and placement of a Foley catheter She received Ancef preoperatively as well as Toradol A Pfannenstiel skin incision was made and carried down sharply to the rectus fascia which is scored in midline extended laterally The fascia was taken off the muscle superiorly and inferiorly without difficulty The muscles were divided and the peritoneal cavity was entered At the time of peritoneal entry was obvious the patient had a primary infectious process at primarily involving the right adnexa I performed extensive blunt dissection to get the sigmoid colon and small bowel and cecum and appendix dissected away from the tubo-ovarian abscess before any sharp dissection or blood supply ligation was performed This probably took about 30 minutes and resulted in disruption of the tubo-ovarian abscess with purulent drainage being expressed and cultures were done at this point  The left retroperitoneal space was identified  and opened up lateral to the left infundibulopelvic ligament The left infundibulopelvic ligament was clamped cut and trans-double suture ligated The left ovary was then removed by crossclamping the  utero-ovarian ligament The bladder was dissected off the lower uterine segment of the left without difficulty The tubo-ovarian abscess in the right was again liberated from the right pelvic sidewall posterior uterus sacral promontory by blunt dissection I did not clamp across the right ovarian blood supply because I was unsure of the anatomy at this point I did clamp across the right utero-ovarian ligament and the right tube and ovary were removed en masse really is a tubo-ovarian abscess The vesicouterine serosal flap was taken down on the right The uterine vessels were clamped cut and suture-ligated bilaterally 2 additional pedicles were taken down the cervix through the cardinal ligament each pedicle being clamped cut transfixion suture ligated Because the sigmoid colon was densely adherent to the lower uterine segment cul-de-sac because of the chronic nature of the infectious process I decided to leave the lower half of the cervix so as not to potentially injure the sigmoid colon As a result of the uterus lower uterine segment and cervix were then transected off the cervix at this point and the cervix was closed with interrupted figure-of-eight sutures with good hemostasis I was then able to identify the right infundibulopelvic ovarian blood supply and used hemoclips and a single figure-of-eight suture to ligate this pedicle There was a coarse peritoneal oozing because of the significant blunt dissection but overall good hemostasis estimated blood loss for the procedure was 200 cc Vigorous pelvic irrigation was performed due to the infectious process 2 Arista's were used and sprinkled throughout the pelvis for hemostasis of the Rall inflammatory infectious peritoneal surfaces again with good hemostasis All counts were correct at this point The muscles and peritoneum were closed loosely The fascia was closed using 0 Vicryl running Subcutaneous tissue was made hemostatic and irrigated The skin was  closed using skin staples Any cc of Exparel was injected  The patient tolerated the procedure well she experienced about 200 cc of blood loss during the procedure She received Ancef and Toradol preoperatively and I added 600 mg of clindamycin and 100 mg of gentamicin intraoperatively due to the interruption of the abscess  She was awakened from anesthesia taken covering good stable condition again with final counts being correct  Florian Buff, MD 03/19/2019 1:59 PM

## 2019-03-19 NOTE — H&P (Signed)
Preoperative History and Physical  Sabrina Spence is a 53 y.o. G1P1001 with Patient's last menstrual period was 03/06/2019. admitted for a TAH BSO.   Pt is seen for follow up of her recent hospitalization revealing a complex right ovarian mass, possible PID/TOA with elevated WBC and fever associated.  She has long standing menorrhagia with associated anemis, MCV 84, and also 75% dysparunia, bump.   I reviewed admission/progress/discharge notes, all labs and scans from her recent hospitalization.  PMH:    Past Medical History:  Diagnosis Date  . Hypertension     PSH:     Past Surgical History:  Procedure Laterality Date  . CESAREAN SECTION    . CHOLECYSTECTOMY      POb/GynH:      OB History    Gravida  1   Para  1   Term  1   Preterm      AB      Living  1     SAB      TAB      Ectopic      Multiple      Live Births  1           SH:   Social History   Tobacco Use  . Smoking status: Never Smoker  . Smokeless tobacco: Never Used  Substance Use Topics  . Alcohol use: Never    Alcohol/week: 0.0 standard drinks    Frequency: Never  . Drug use: Never    FH:    Family History  Problem Relation Age of Onset  . Congestive Heart Failure Maternal Grandmother   . Stroke Maternal Grandfather   . Cancer Father      Allergies:  Allergies  Allergen Reactions  . Doxycycline Hyclate Itching and Rash    Medications:       Current Facility-Administered Medications:  .  bupivacaine liposome (EXPAREL) 1.3 % injection 266 mg, 20 mL, Infiltration, Once, Eure, Luther H, MD .  ceFAZolin (ANCEF) IVPB 2g/100 mL premix, 2 g, Intravenous, On Call to OR, Florian Buff, MD .  lactated ringers infusion, , Intravenous, Continuous, Lenice Llamas, MD, Last Rate: 50 mL/hr at 03/19/19 0947, 1,000 mL at 03/19/19 0947  Review of Systems:   Review of Systems  Constitutional: Negative for fever, chills, weight loss, malaise/fatigue and diaphoresis.  HENT: Negative  for hearing loss, ear pain, nosebleeds, congestion, sore throat, neck pain, tinnitus and ear discharge.   Eyes: Negative for blurred vision, double vision, photophobia, pain, discharge and redness.  Respiratory: Negative for cough, hemoptysis, sputum production, shortness of breath, wheezing and stridor.   Cardiovascular: Negative for chest pain, palpitations, orthopnea, claudication, leg swelling and PND.  Gastrointestinal: Positive for abdominal pain. Negative for heartburn, nausea, vomiting, diarrhea, constipation, blood in stool and melena.  Genitourinary: Negative for dysuria, urgency, frequency, hematuria and flank pain.  Musculoskeletal: Negative for myalgias, back pain, joint pain and falls.  Skin: Negative for itching and rash.  Neurological: Negative for dizziness, tingling, tremors, sensory change, speech change, focal weakness, seizures, loss of consciousness, weakness and headaches.  Endo/Heme/Allergies: Negative for environmental allergies and polydipsia. Does not bruise/bleed easily.  Psychiatric/Behavioral: Negative for depression, suicidal ideas, hallucinations, memory loss and substance abuse. The patient is not nervous/anxious and does not have insomnia.      PHYSICAL EXAM:  Blood pressure 140/89, pulse 82, temperature 98.4 F (36.9 C), temperature source Oral, resp. rate 17, last menstrual period 03/06/2019, SpO2 99 %.    Vitals reviewed. Constitutional:  She is oriented to person, place, and time. She appears well-developed and well-nourished.  HENT:  Head: Normocephalic and atraumatic.  Right Ear: External ear normal.  Left Ear: External ear normal.  Nose: Nose normal.  Mouth/Throat: Oropharynx is clear and moist.  Eyes: Conjunctivae and EOM are normal. Pupils are equal, round, and reactive to light. Right eye exhibits no discharge. Left eye exhibits no discharge. No scleral icterus.  Neck: Normal range of motion. Neck supple. No tracheal deviation present. No  thyromegaly present.  Cardiovascular: Normal rate, regular rhythm, normal heart sounds and intact distal pulses.  Exam reveals no gallop and no friction rub.   No murmur heard. Respiratory: Effort normal and breath sounds normal. No respiratory distress. She has no wheezes. She has no rales. She exhibits no tenderness.  GI: Soft. Bowel sounds are normal. She exhibits no distension and no mass. There is tenderness. There is no rebound and no guarding.  Genitourinary:       Vulva is normal without lesions Vagina is pink moist without discharge Cervix normal in appearance and pap is normal Uterus is normal size, contour, position, consistency, mobility, non-tender Adnexa is negative with normal sized ovaries by sonogram  Musculoskeletal: Normal range of motion. She exhibits no edema and no tenderness.  Neurological: She is alert and oriented to person, place, and time. She has normal reflexes. She displays normal reflexes. No cranial nerve deficit. She exhibits normal muscle tone. Coordination normal.  Skin: Skin is warm and dry. No rash noted. No erythema. No pallor.  Psychiatric: She has a normal mood and affect. Her behavior is normal. Judgment and thought content normal.    Labs: Results for orders placed or performed during the hospital encounter of 03/17/19 (from the past 336 hour(s))  Urinalysis, Routine w reflex microscopic   Collection Time: 03/17/19  9:56 AM  Result Value Ref Range   Color, Urine YELLOW YELLOW   APPearance HAZY (A) CLEAR   Specific Gravity, Urine 1.018 1.005 - 1.030   pH 5.0 5.0 - 8.0   Glucose, UA NEGATIVE NEGATIVE mg/dL   Hgb urine dipstick NEGATIVE NEGATIVE   Bilirubin Urine NEGATIVE NEGATIVE   Ketones, ur NEGATIVE NEGATIVE mg/dL   Protein, ur NEGATIVE NEGATIVE mg/dL   Nitrite NEGATIVE NEGATIVE   Leukocytes,Ua NEGATIVE NEGATIVE  CBC   Collection Time: 03/17/19 10:05 AM  Result Value Ref Range   WBC 6.1 4.0 - 10.5 K/uL   RBC 3.70 (L) 3.87 - 5.11 MIL/uL    Hemoglobin 10.3 (L) 12.0 - 15.0 g/dL   HCT 32.6 (L) 36.0 - 46.0 %   MCV 88.1 80.0 - 100.0 fL   MCH 27.8 26.0 - 34.0 pg   MCHC 31.6 30.0 - 36.0 g/dL   RDW 19.1 (H) 11.5 - 15.5 %   Platelets 363 150 - 400 K/uL   nRBC 0.0 0.0 - 0.2 %  Comprehensive metabolic panel   Collection Time: 03/17/19 10:05 AM  Result Value Ref Range   Sodium 137 135 - 145 mmol/L   Potassium 3.6 3.5 - 5.1 mmol/L   Chloride 105 98 - 111 mmol/L   CO2 21 (L) 22 - 32 mmol/L   Glucose, Bld 166 (H) 70 - 99 mg/dL   BUN 9 6 - 20 mg/dL   Creatinine, Ser 0.86 0.44 - 1.00 mg/dL   Calcium 9.3 8.9 - 10.3 mg/dL   Total Protein 7.8 6.5 - 8.1 g/dL   Albumin 3.3 (L) 3.5 - 5.0 g/dL   AST 26 15 -  41 U/L   ALT 22 0 - 44 U/L   Alkaline Phosphatase 56 38 - 126 U/L   Total Bilirubin 0.5 0.3 - 1.2 mg/dL   GFR calc non Af Amer >60 >60 mL/min   GFR calc Af Amer >60 >60 mL/min   Anion gap 11 5 - 15  hCG, quantitative, pregnancy   Collection Time: 03/17/19 10:05 AM  Result Value Ref Range   hCG, Beta Chain, Quant, S 2 <5 mIU/mL  Rapid HIV screen (HIV 1/2 Ab+Ag)   Collection Time: 03/17/19 10:05 AM  Result Value Ref Range   HIV-1 P24 Antigen - HIV24 NON REACTIVE NON REACTIVE   HIV 1/2 Antibodies NON REACTIVE NON REACTIVE   Interpretation (HIV Ag Ab)      A non reactive test result means that HIV 1 or HIV 2 antibodies and HIV 1 p24 antigen were not detected in the specimen.  Type and screen   Collection Time: 03/17/19 10:05 AM  Result Value Ref Range   ABO/RH(D) O POS    Antibody Screen NEG    Sample Expiration 03/31/2019,2359    Extend sample reason      NO TRANSFUSIONS OR PREGNANCY IN THE PAST 3 MONTHS Performed at [facility], [address] [place], [place] [address]   Results for orders placed or performed during the hospital encounter of 03/17/19 (from the past 336 hour(s))  SARS CORONAVIRUS 2 (TAT 6-24 HRS) Nasopharyngeal Nasopharyngeal Swab   Collection Time: 03/17/19  7:40 AM   Specimen: Nasopharyngeal Swab   Result Value Ref Range   SARS Coronavirus 2 NEGATIVE NEGATIVE  Results for orders placed or performed in visit on 03/06/19 (from the past 336 hour(s))  CA 125   Collection Time: 03/06/19  9:34 AM  Result Value Ref Range   Cancer Antigen (CA) 125 55.6 (H) 0.0 - 38.1 U/mL    EKG: Orders placed or performed during the hospital encounter of 03/19/19  . EKG 12-Lead  . EKG 12-Lead    Imaging Studies: US Transvaginal Non-ob  Result Date: 02/19/2019 CLINICAL DATA:  Abnormal CT today. Abdominal pain started this morning. EXAM: TRANSABDOMINAL AND TRANSVAGINAL ULTRASOUND OF PELVIS DOPPLER ULTRASOUND OF OVARIES TECHNIQUE: Both transabdominal and transvaginal ultrasound examinations of the pelvis were performed. Transabdominal technique was performed for global imaging of the pelvis including uterus, ovaries, adnexal regions, and pelvic cul-de-sac. It was necessary to proceed with endovaginal exam following the transabdominal exam to visualize the endometrium and adnexal regions. Color and duplex Doppler ultrasound was utilized to evaluate blood flow to the ovaries. COMPARISON:  CT of the abdomen and pelvis on 02/19/2019 FINDINGS: Uterus Measurements: 11.0 x 5.5 x 7.4 centimeters = volume: 233.3 mL. Multiple fibroids are present. Anterior fibroid is 2.0 x 1.6 x 1.9 centimeters. Second fibroid is 0.8 x 0.7 x 0.8 centimeters. Endometrium Thickness: 10.0 millimeters.  No focal abnormality visualized. Right ovary Measurements: 6.1 x 5.4 x 5.5 centimeters = volume: 96.0 mL. A complex cystic multi-septated mass in the RIGHT ovary measures 3.9 x 3.4 x 5.0 centimeters. Mass has a thickened wall and irregular avascular septations. Left ovary Measurements: 3.4 x 3.6 x 3.6 centimeters = volume: 23.3 mL. Largest follicle is 2.7 centimeters. Pulsed Doppler evaluation of both ovaries demonstrates normal low-resistance arterial and venous waveforms. Other findings Small to moderate amount of free pelvic fluid. IMPRESSION:  1. Complex cystic mass in the RIGHT ovary with avascular septations measuring 3.9 centimeters. There is no evidence for ovarian torsion at this time. The appearance favors hemorrhagic  cyst and follow-up is recommended. Recommend follow-up pelvic ultrasound in 6-12 weeks. 2. Uterine fibroids. 3. Normal appearance of the LEFT ovary. Electronically Signed   By: Nolon Nations M.D.   On: 02/19/2019 16:35   US Pelvis Complete  Result Date: 02/19/2019 CLINICAL DATA:  Abnormal CT today. Abdominal pain started this morning. EXAM: TRANSABDOMINAL AND TRANSVAGINAL ULTRASOUND OF PELVIS DOPPLER ULTRASOUND OF OVARIES TECHNIQUE: Both transabdominal and transvaginal ultrasound examinations of the pelvis were performed. Transabdominal technique was performed for global imaging of the pelvis including uterus, ovaries, adnexal regions, and pelvic cul-de-sac. It was necessary to proceed with endovaginal exam following the transabdominal exam to visualize the endometrium and adnexal regions. Color and duplex Doppler ultrasound was utilized to evaluate blood flow to the ovaries. COMPARISON:  CT of the abdomen and pelvis on 02/19/2019 FINDINGS: Uterus Measurements: 11.0 x 5.5 x 7.4 centimeters = volume: 233.3 mL. Multiple fibroids are present. Anterior fibroid is 2.0 x 1.6 x 1.9 centimeters. Second fibroid is 0.8 x 0.7 x 0.8 centimeters. Endometrium Thickness: 10.0 millimeters.  No focal abnormality visualized. Right ovary Measurements: 6.1 x 5.4 x 5.5 centimeters = volume: 96.0 mL. A complex cystic multi-septated mass in the RIGHT ovary measures 3.9 x 3.4 x 5.0 centimeters. Mass has a thickened wall and irregular avascular septations. Left ovary Measurements: 3.4 x 3.6 x 3.6 centimeters = volume: 23.3 mL. Largest follicle is 2.7 centimeters. Pulsed Doppler evaluation of both ovaries demonstrates normal low-resistance arterial and venous waveforms. Other findings Small to moderate amount of free pelvic fluid. IMPRESSION: 1. Complex  cystic mass in the RIGHT ovary with avascular septations measuring 3.9 centimeters. There is no evidence for ovarian torsion at this time. The appearance favors hemorrhagic cyst and follow-up is recommended. Recommend follow-up pelvic ultrasound in 6-12 weeks. 2. Uterine fibroids. 3. Normal appearance of the LEFT ovary. Electronically Signed   By: Nolon Nations M.D.   On: 02/19/2019 16:35   Ct Abdomen Pelvis W Contrast  Result Date: 02/19/2019 CLINICAL DATA:  Acute lower pelvic pain and cramping left worse than right radiating across the anterior abdomen beginning this morning. Decreased appetite 1 week. EXAM: CT ABDOMEN AND PELVIS WITH CONTRAST TECHNIQUE: Multidetector CT imaging of the abdomen and pelvis was performed using the standard protocol following bolus administration of intravenous contrast. CONTRAST:  158mL OMNIPAQUE IOHEXOL 300 MG/ML  SOLN COMPARISON:  None. FINDINGS: Lower chest: Lung bases are within normal. Small to moderate size hiatal hernia. Hepatobiliary: Previous cholecystectomy. Liver and biliary tree are normal. Pancreas: Normal. Spleen: Normal. Adrenals/Urinary Tract: Adrenal glands are normal. Kidneys are normal in size without hydronephrosis or nephrolithiasis. Ureters and bladder are normal. Stomach/Bowel: Small to moderate size hiatal hernia. Small bowel is within normal. Appendix is within normal. Colon is decompressed from the appendix flexure distally and otherwise unremarkable. Vascular/Lymphatic: Normal vasculature. Few small periaortic and right iliac chain lymph nodes. Reproductive: Uterus demonstrates a subcentimeter calcification over the fundus as the uterus and left ovary are otherwise normal. Right ovary is slightly enlarged measuring 5.2 x 6.6 x 6.8 cm and demonstrates cystic change there is mild stranding of the fat adjacent the right ovary and a small amount of free fluid in the pelvis. These findings could be seen due to torsion versus PID/tubo-ovarian abscess.  Other: None. Musculoskeletal: No acute findings. IMPRESSION: 1. Slightly enlarged right ovary with cystic change and stranding of the adjacent fat as well as mild amount of adjacent free fluid. Findings may represent an acute process such as ovarian torsion or infection  such as PID/tubo-ovarian abscess. Recommend pelvic ultrasound for further evaluation. 2.  Small to moderate size hiatal hernia. Electronically Signed   By: Marin Olp M.D.   On: 02/19/2019 15:00   [place] Art/ven Flow Abd Pelv Doppler  Result Date: 02/19/2019 CLINICAL DATA:  Abnormal CT today. Abdominal pain started this morning. EXAM: TRANSABDOMINAL AND TRANSVAGINAL ULTRASOUND OF PELVIS DOPPLER ULTRASOUND OF OVARIES TECHNIQUE: Both transabdominal and transvaginal ultrasound examinations of the pelvis were performed. Transabdominal technique was performed for global imaging of the pelvis including uterus, ovaries, adnexal regions, and pelvic cul-de-sac. It was necessary to proceed with endovaginal exam following the transabdominal exam to visualize the endometrium and adnexal regions. Color and duplex Doppler ultrasound was utilized to evaluate blood flow to the ovaries. COMPARISON:  CT of the abdomen and pelvis on 02/19/2019 FINDINGS: Uterus Measurements: 11.0 x 5.5 x 7.4 centimeters = volume: 233.3 mL. Multiple fibroids are present. Anterior fibroid is 2.0 x 1.6 x 1.9 centimeters. Second fibroid is 0.8 x 0.7 x 0.8 centimeters. Endometrium Thickness: 10.0 millimeters.  No focal abnormality visualized. Right ovary Measurements: 6.1 x 5.4 x 5.5 centimeters = volume: 96.0 mL. A complex cystic multi-septated mass in the RIGHT ovary measures 3.9 x 3.4 x 5.0 centimeters. Mass has a thickened wall and irregular avascular septations. Left ovary Measurements: 3.4 x 3.6 x 3.6 centimeters = volume: 23.3 mL. Largest follicle is 2.7 centimeters. Pulsed Doppler evaluation of both ovaries demonstrates normal low-resistance arterial and venous waveforms. Other  findings Small to moderate amount of free pelvic fluid. IMPRESSION: 1. Complex cystic mass in the RIGHT ovary with avascular septations measuring 3.9 centimeters. There is no evidence for ovarian torsion at this time. The appearance favors hemorrhagic cyst and follow-up is recommended. Recommend follow-up pelvic ultrasound in 6-12 weeks. 2. Uterine fibroids. 3. Normal appearance of the LEFT ovary. Electronically Signed   By: Nolon Nations M.D.   On: 02/19/2019 16:35      Assessment: 1. Ovarian mass, right  N83.8 CA 125  2. Menometrorrhagia  N92.1 CA 125  3. Dysmenorrhea  N94.6 CA 125  4. Dyspareunia, female  N94.10   5. PID (acute pelvic inflammatory disease)        Plan: Due to patients significant anemia due to menorrhagia, her severe dysmenorrhea and her painful complex ovarian cyst, 75% dysparunia, I am recommending a mini lap TAH BSO for definitive management of all issues,ablation would not be appropriate in light of dyspareunia.  As a result cervix will also need to be removed due to that.  CA 125 is mildly elevated 55.6   Megace to stabilize bleeding and hopefully increase her hemoglobin pre operatively.  Will schedule for 03/19/2019.    Florian Buff 03/19/2019 10:28 AM

## 2019-03-19 NOTE — Anesthesia Procedure Notes (Signed)
Procedure Name: Intubation Date/Time: 03/19/2019 11:45 AM Performed by: Vista Deck, CRNA Pre-anesthesia Checklist: Patient identified, Patient being monitored, Timeout performed, Emergency Drugs available and Suction available Patient Re-evaluated:Patient Re-evaluated prior to induction Oxygen Delivery Method: Circle System Utilized Preoxygenation: Pre-oxygenation with 100% oxygen Induction Type: IV induction Ventilation: Mask ventilation without difficulty Laryngoscope Size: Mac and 3 Grade View: Grade I Tube type: Oral Tube size: 7.0 mm Number of attempts: 1 Airway Equipment and Method: stylet Placement Confirmation: ETT inserted through vocal cords under direct vision,  positive ETCO2 and breath sounds checked- equal and bilateral Secured at: 22 cm Tube secured with: Tape Dental Injury: Teeth and Oropharynx as per pre-operative assessment

## 2019-03-19 NOTE — Progress Notes (Addendum)
Pt's foley cath is removed, per order. Drained 124mL clear/yellow urine from drainage bag. Pt educated to void as soon as possible. Pt up and ambulated in room, short distance. Pt complaining of pain at this time. Given PRN 5mg  oxycodone. Will continue to monitor pt.

## 2019-03-19 NOTE — Anesthesia Postprocedure Evaluation (Signed)
Anesthesia Post Note  Patient: Sabrina Spence  Procedure(s) Performed: HYSTERECTOMY SUPRACERVICAL  ABDOMINAL (N/A Abdomen) OPEN SALPINGO OOPHORECTOMY (Bilateral Abdomen)  Patient location during evaluation: PACU Anesthesia Type: General Level of consciousness: awake, oriented, awake and alert and patient cooperative Pain management: pain level controlled Vital Signs Assessment: post-procedure vital signs reviewed and stable Respiratory status: respiratory function stable, spontaneous breathing, nonlabored ventilation and patient connected to nasal cannula oxygen Cardiovascular status: stable Postop Assessment: no apparent nausea or vomiting Anesthetic complications: no     Last Vitals:  Vitals:   03/19/19 1515 03/19/19 1517  BP:  137/81  Pulse: 80 83  Resp: 16 15  Temp:  36.6 C  SpO2: 93% 93%    Last Pain:  Vitals:   03/19/19 1517  TempSrc:   PainSc: (P) 6                  Jawann Urbani

## 2019-03-20 ENCOUNTER — Encounter (HOSPITAL_COMMUNITY): Payer: Self-pay | Admitting: Obstetrics & Gynecology

## 2019-03-20 LAB — BASIC METABOLIC PANEL
Anion gap: 13 (ref 5–15)
BUN: 10 mg/dL (ref 6–20)
CO2: 20 mmol/L — ABNORMAL LOW (ref 22–32)
Calcium: 8.6 mg/dL — ABNORMAL LOW (ref 8.9–10.3)
Chloride: 99 mmol/L (ref 98–111)
Creatinine, Ser: 0.91 mg/dL (ref 0.44–1.00)
GFR calc Af Amer: >60 mL/min (ref >60)
GFR calc non Af Amer: >60 mL/min (ref >60)
Glucose, Bld: 206 mg/dL — ABNORMAL HIGH (ref 70–99)
Potassium: 4.4 mmol/L (ref 3.5–5.1)
Sodium: 132 mmol/L — ABNORMAL LOW (ref 135–145)

## 2019-03-20 LAB — CBC
HCT: 35.6 % — ABNORMAL LOW (ref 36.0–46.0)
Hemoglobin: 11.4 g/dL — ABNORMAL LOW (ref 12.0–15.0)
MCH: 27.8 pg (ref 26.0–34.0)
MCHC: 32 g/dL (ref 30.0–36.0)
MCV: 86.8 fL (ref 80.0–100.0)
Platelets: 405 10*3/uL — ABNORMAL HIGH (ref 150–400)
RBC: 4.1 MIL/uL (ref 3.87–5.11)
RDW: 18.6 % — ABNORMAL HIGH (ref 11.5–15.5)
WBC: 12.3 10*3/uL — ABNORMAL HIGH (ref 4.0–10.5)
nRBC: 0 % (ref 0.0–0.2)

## 2019-03-20 MED ORDER — LACTATED RINGERS IV BOLUS: 1000.0000 mL | Freq: Once | INTRAVENOUS | Status: DC

## 2019-03-20 MED ORDER — LACTATED RINGERS IV BOLUS
1000.0000 mL | Freq: Once | INTRAVENOUS | Status: AC
  Administered 2019-03-20: 05:00:00 1000 mL via INTRAVENOUS

## 2019-03-20 NOTE — Addendum Note (Signed)
Addendum  created 03/20/19 0818 by Vista Deck, CRNA   Intraprocedure Event edited, Intraprocedure Staff edited

## 2019-03-20 NOTE — Progress Notes (Signed)
Pt's BP is currently 148/95, HR 114, Temp 99.2. MD notified. Instructed not to give second LR bolus and to hold AM tramadol and AM Lovenox. Will continue to monitor pt.

## 2019-03-20 NOTE — Progress Notes (Signed)
ECG obtained and patient placed on cardiac monitoring. HR 114 at this time. Patient voided 300cc of clear, amber urine with no problems. Foley catheter not reinserted at this time. MD notified. LR bolus started. Patient resting at this time. Honeycomb dressing to lower abd clean dry and intact. Will continue to monitor.

## 2019-03-20 NOTE — Progress Notes (Signed)
Pt's HR is sustaining in the 120s to 130s. Pt is currently asymptomatic. Pt has not voided since foley removal. MD notified. Instructed to obtain ECG, place on cardiac monitor, reinsert foley cath, give two liters of LR bolus. Will continue to monitor and update pt's status.

## 2019-03-20 NOTE — Progress Notes (Signed)
MD called. Instructed to give second LR bolus. Second LR bolus is currently infusing at this time. Will continue to monitor pt.

## 2019-03-20 NOTE — Progress Notes (Signed)
1 Day Post-Op Procedure(s) (LRB): HYSTERECTOMY SUPRACERVICAL  ABDOMINAL (N/A) OPEN SALPINGO OOPHORECTOMY (Bilateral)  Subjective: Patient reports incisional pain, tolerating PO and no problems voiding.    Objective: I have reviewed patient's vital signs, intake and output, medications, labs, microbiology and radiology results. Vitals:   03/20/19 0159 03/20/19 0403 03/20/19 0544 03/20/19 0833  BP: 140/90 102/69 (!) 148/95 (!) 142/91  Pulse: (!) 124 (!) 131 (!) 114 (!) 123  Resp: 16 20 16 20   Temp: 99.3 F (37.4 C) 98.8 F (37.1 C) 99.2 F (37.3 C) 99.4 F (37.4 C)  TempSrc: Oral Oral Oral Oral  SpO2: 100% 100% 98% 99%    General: alert, cooperative and no distress GI: soft, non-tender; bowel sounds normal; no masses,  no organomegaly Vaginal Bleeding: none   Results for orders placed or performed during the hospital encounter of 03/19/19 (from the past 24 hour(s))  Aerobic/Anaerobic Culture (surgical/deep wound)     Status: None (Preliminary result)   Collection Time: 03/19/19 12:21 PM   Specimen: PATH Other; Tissue  Result Value Ref Range   Specimen Description      TISSUE Performed at [facility], [address], [place], [place] [address]    Special Requests PELVIC ABCESS    Gram Stain      MODERATE WBC PRESENT,BOTH PMN AND MONONUCLEAR NO ORGANISMS SEEN Performed at [facility], [place] [address] [place], [place] [address]    Culture PENDING    Report Status PENDING   CBC     Status: Abnormal   Collection Time: 03/20/19  4:27 AM  Result Value Ref Range   WBC 12.3 (H) 4.0 - 10.5 K/uL   RBC 4.10 3.87 - 5.11 MIL/uL   Hemoglobin 11.4 (L) 12.0 - 15.0 g/dL   HCT 35.6 (L) 36.0 - 46.0 %   MCV 86.8 80.0 - 100.0 fL   MCH 27.8 26.0 - 34.0 pg   MCHC 32.0 30.0 - 36.0 g/dL   RDW 18.6 (H) 11.5 - 15.5 %   Platelets 405 (H) 150 - 400 K/uL   nRBC 0.0 0.0 - 0.2 %  Basic metabolic panel     Status: Abnormal   Collection Time: 03/20/19  4:27 AM  Result Value Ref Range   Sodium 132 (L) 135 - 145 mmol/L   Potassium 4.4 3.5 - 5.1 mmol/L   Chloride 99 98 - 111 mmol/L   CO2 20 (L) 22 - 32 mmol/L   Glucose, Bld 206 (H) 70 - 99 mg/dL   BUN 10 6 - 20 mg/dL   Creatinine, Ser 0.91 0.44 - 1.00 mg/dL   Calcium 8.6 (L) 8.9 - 10.3 mg/dL   GFR calc non Af Amer >60 >60 mL/min   GFR calc Af Amer >60 >60 mL/min   Anion gap 13 5 - 15    Assessment: s/p Procedure(s): HYSTERECTOMY SUPRACERVICAL  ABDOMINAL (N/A) OPEN SALPINGO OOPHORECTOMY (Bilateral): stable  Plan: Advance diet Encourage ambulation Advance to PO medication Continue ABX therapy due to Post-op infection pt had intra op finding of pelvic abscess  Based on pulse and rise in hemoglobin it would appear pt is dry so given 2 boluses could also be related to the pelvic abscess but no evidence of sepsis, if temp spike will do blood cultures and lactic acid Continue zosyn for 48 hours at least and then plan to switch to oral cipro and flagyl, awaiting culture report  Anticipate hospital stay of 72-96 hours post op based on intra operative fidnings  LOS: 1  day    Florian Buff 03/20/2019, 10:59 AM

## 2019-03-21 MED ORDER — LEVOFLOXACIN IN D5W 750 MG/150ML IV SOLN
750.0000 mg | Freq: Once | INTRAVENOUS | Status: AC
  Administered 2019-03-21: 750 mg via INTRAVENOUS
  Filled 2019-03-21: qty 150

## 2019-03-21 NOTE — Progress Notes (Signed)
2 Days Post-Op Procedure(s) (LRB): HYSTERECTOMY SUPRACERVICAL  ABDOMINAL (N/A) OPEN SALPINGO OOPHORECTOMY (Bilateral)  Subjective: Patient reports +flattus voiding without problems Ambulatory Feels better.    Objective: I have reviewed patient's vital signs, intake and output, medications, labs, microbiology, pathology and radiology results.  General: alert, cooperative and no distress GI: soft, non-tender; bowel sounds normal; no masses,  no organomegaly Vaginal Bleeding: none incision clean dry intact dressing in place  Assessment: s/p Procedure(s): HYSTERECTOMY SUPRACERVICAL  ABDOMINAL (N/A) OPEN SALPINGO OOPHORECTOMY (Bilateral): stable  Plan: Encourage ambulation Discontinue IV fluids discharge tomorrow if continues to be afebrile   Plan to discharge on ciprofloxacin and flagyl for 10 day course   LOS: 2 days    Florian Buff 03/21/2019, 5:58 PM

## 2019-03-21 NOTE — Progress Notes (Signed)
Attempted IV two times in right wrist both unsuccessful. No hematoma, bandage dressing applied.

## 2019-03-22 LAB — CBC WITH DIFFERENTIAL/PLATELET
Abs Immature Granulocytes: 0.1 10*3/uL — ABNORMAL HIGH (ref 0.00–0.07)
Basophils Absolute: 0 10*3/uL (ref 0.0–0.1)
Basophils Relative: 0 %
Eosinophils Absolute: 0.2 10*3/uL (ref 0.0–0.5)
Eosinophils Relative: 2 %
HCT: 25.7 % — ABNORMAL LOW (ref 36.0–46.0)
Hemoglobin: 8 g/dL — ABNORMAL LOW (ref 12.0–15.0)
Immature Granulocytes: 1 %
Lymphocytes Relative: 11 %
Lymphs Abs: 1.4 10*3/uL (ref 0.7–4.0)
MCH: 27.9 pg (ref 26.0–34.0)
MCHC: 31.1 g/dL (ref 30.0–36.0)
MCV: 89.5 fL (ref 80.0–100.0)
Monocytes Absolute: 0.9 10*3/uL (ref 0.1–1.0)
Monocytes Relative: 7 %
Neutro Abs: 10.3 10*3/uL — ABNORMAL HIGH (ref 1.7–7.7)
Neutrophils Relative %: 79 %
Platelets: 295 10*3/uL (ref 150–400)
RBC: 2.87 MIL/uL — ABNORMAL LOW (ref 3.87–5.11)
RDW: 18.9 % — ABNORMAL HIGH (ref 11.5–15.5)
WBC: 12.9 10*3/uL — ABNORMAL HIGH (ref 4.0–10.5)
nRBC: 0 % (ref 0.0–0.2)

## 2019-03-22 MED ORDER — KETOROLAC TROMETHAMINE 10 MG PO TABS: 10.0000 mg | ORAL_TABLET | Freq: Three times a day (TID) | ORAL | 0 refills | Status: AC | PRN

## 2019-03-22 MED ORDER — METRONIDAZOLE 500 MG PO TABS: 500.0000 mg | ORAL_TABLET | Freq: Two times a day (BID) | ORAL | 0 refills | Status: AC

## 2019-03-22 MED ORDER — CIPROFLOXACIN HCL 500 MG PO TABS: 500.0000 mg | ORAL_TABLET | Freq: Two times a day (BID) | ORAL | 0 refills | Status: AC

## 2019-03-22 MED ORDER — ONDANSETRON HCL 8 MG PO TABS: 8.0000 mg | ORAL_TABLET | Freq: Four times a day (QID) | ORAL | 0 refills | Status: AC | PRN

## 2019-03-22 MED ORDER — OXYCODONE HCL 5 MG PO TABS: 5.0000 mg | ORAL_TABLET | ORAL | 0 refills | Status: AC | PRN

## 2019-03-22 NOTE — Progress Notes (Signed)
Nsg Discharge Note  Admit Date:  03/19/2019 Discharge date: 03/22/2019   Sabrina Spence to be D/C'd home per MD order.  AVS completed.  Copy for chart, and copy for patient signed, and dated. Patient/caregiver able to verbalize understanding.  Discharge Medication: Allergies as of 03/22/2019      Reactions   Doxycycline Hyclate Itching, Rash      Medication List    STOP taking these medications   megestrol 40 MG tablet Commonly known as: MEGACE   promethazine 25 MG tablet Commonly known as: PHENERGAN     TAKE these medications   amLODipine 10 MG tablet Commonly known as: NORVASC Take 10 mg by mouth daily.   Biotin 10 MG Tabs Take 10 mg by mouth daily.   ciprofloxacin 500 MG tablet Commonly known as: Cipro Take 1 tablet (500 mg total) by mouth 2 (two) times daily.   COLLAGEN PO Take 2 tablets by mouth daily.   ferrous sulfate 325 (65 FE) MG tablet Take 325 mg by mouth daily with breakfast.   ibuprofen 600 MG tablet Commonly known as: ADVIL Take 1 tablet (600 mg total) by mouth every 6 (six) hours as needed.   ketorolac 10 MG tablet Commonly known as: TORADOL Take 1 tablet (10 mg total) by mouth every 8 (eight) hours as needed.   metroNIDAZOLE 500 MG tablet Commonly known as: Flagyl Take 1 tablet (500 mg total) by mouth 2 (two) times daily.   multivitamin with minerals Tabs tablet Take 1 tablet by mouth daily.   ondansetron 8 MG tablet Commonly known as: ZOFRAN Take 1 tablet (8 mg total) by mouth every 6 (six) hours as needed for nausea.   oxyCODONE 5 MG immediate release tablet Commonly known as: Oxy IR/ROXICODONE Take 1-2 tablets (5-10 mg total) by mouth every 4 (four) hours as needed for moderate pain.   pantoprazole 40 MG tablet Commonly known as: PROTONIX Take 40 mg by mouth daily as needed (heartburn).   TURMERIC CURCUMIN PO Take 1 capsule by mouth daily.       Discharge Assessment: Vitals:   03/21/19 2122 03/22/19 0518  BP: 123/76  136/84  Pulse: (!) 101 (!) 101  Resp: 16 16  Temp: 98.7 F (37.1 C) 98.9 F (37.2 C)  SpO2: 100% 97%   Skin clean, dry and intact without evidence of skin break down, no evidence of skin tears noted. IV catheter discontinued intact. Site without signs and symptoms of complications - no redness or edema noted at insertion site, patient denies c/o pain - only slight tenderness at site.  Dressing with slight pressure applied.  D/c Instructions-Education: Discharge instructions given to patient/family with verbalized understanding. D/c education completed with patient/family including follow up instructions, medication list, d/c activities limitations if indicated, with other d/c instructions as indicated by MD - patient able to verbalize understanding, all questions fully answered. Patient instructed to return to ED, call 911, or call MD for any changes in condition.  Patient escorted via [place], and D/C home via private auto.  Venita Sheffield, RN 03/22/2019 2:24 PM

## 2019-03-22 NOTE — Plan of Care (Signed)
  Problem: Education: Goal: Knowledge of General Education information will improve Description: Including pain rating scale, medication(s)/side effects and non-pharmacologic comfort measures Outcome: Adequate for Discharge   Problem: Health Behavior/Discharge Planning: Goal: Ability to manage health-related needs will improve Outcome: Adequate for Discharge   Problem: Clinical Measurements: Goal: Ability to maintain clinical measurements within normal limits will improve Outcome: Adequate for Discharge Goal: Will remain free from infection Outcome: Adequate for Discharge Goal: Diagnostic test results will improve Outcome: Adequate for Discharge Goal: Respiratory complications will improve Outcome: Adequate for Discharge Goal: Cardiovascular complication will be avoided Outcome: Adequate for Discharge   Problem: Activity: Goal: Risk for activity intolerance will decrease Outcome: Adequate for Discharge   Problem: Nutrition: Goal: Adequate nutrition will be maintained Outcome: Adequate for Discharge   Problem: Coping: Goal: Level of anxiety will decrease Outcome: Adequate for Discharge   Problem: Elimination: Goal: Will not experience complications related to bowel motility Outcome: Adequate for Discharge Goal: Will not experience complications related to urinary retention Outcome: Adequate for Discharge   Problem: Pain Managment: Goal: General experience of comfort will improve Outcome: Adequate for Discharge

## 2019-03-22 NOTE — Discharge Summary (Signed)
Physician Discharge Summary  Patient ID: Sabrina Spence MRN: [id number] DOB/AGE: March 13, 1966 53 y.o.  Admit date: 03/19/2019 Discharge date: 03/22/2019  Admission Diagnoses: Right adnexal TOA Menometrorrhagia Anemia Discharge Diagnoses:  Active Problems:   S/P hysterectomy   Discharged Condition: good  Hospital Course: post op IV antibiotics, post op dehydration responded to fluid resuscitation  Consults: None  Significant Diagnostic Studies: labs:   Results for orders placed or performed during the hospital encounter of 03/19/19 (from the past 72 hour(s))  CBC     Status: Abnormal   Collection Time: 03/20/19  4:27 AM  Result Value Ref Range   WBC 12.3 (H) 4.0 - 10.5 K/uL   RBC 4.10 3.87 - 5.11 MIL/uL   Hemoglobin 11.4 (L) 12.0 - 15.0 g/dL   HCT 35.6 (L) 36.0 - 46.0 %   MCV 86.8 80.0 - 100.0 fL   MCH 27.8 26.0 - 34.0 pg   MCHC 32.0 30.0 - 36.0 g/dL   RDW 18.6 (H) 11.5 - 15.5 %   Platelets 405 (H) 150 - 400 K/uL   nRBC 0.0 0.0 - 0.2 %    Comment: Performed at [facility], [address], [place], [place] [id number]  Basic metabolic panel     Status: Abnormal   Collection Time: 03/20/19  4:27 AM  Result Value Ref Range   Sodium 132 (L) 135 - 145 mmol/L   Potassium 4.4 3.5 - 5.1 mmol/L   Chloride 99 98 - 111 mmol/L   CO2 20 (L) 22 - 32 mmol/L   Glucose, Bld 206 (H) 70 - 99 mg/dL   BUN 10 6 - 20 mg/dL   Creatinine, Ser 0.91 0.44 - 1.00 mg/dL   Calcium 8.6 (L) 8.9 - 10.3 mg/dL   GFR calc non Af Amer >60 >60 mL/min   GFR calc Af Amer >60 >60 mL/min   Anion gap 13 5 - 15    Comment: Performed at [facility], [address], [place], [place] [address]  CBC with Differential/Platelet     Status: Abnormal   Collection Time: 03/22/19  6:21 AM  Result Value Ref Range   WBC 12.9 (H) 4.0 - 10.5 K/uL   RBC 2.87 (L) 3.87 - 5.11 MIL/uL   Hemoglobin 8.0 (L) 12.0 - 15.0 g/dL   HCT 25.7 (L) 36.0 - 46.0 %   MCV 89.5 80.0 - 100.0 fL   MCH 27.9 26.0 - 34.0 pg   MCHC 31.1 30.0 -  36.0 g/dL   RDW 18.9 (H) 11.5 - 15.5 %   Platelets 295 150 - 400 K/uL   nRBC 0.0 0.0 - 0.2 %   Neutrophils Relative % 79 %   Neutro Abs 10.3 (H) 1.7 - 7.7 K/uL   Lymphocytes Relative 11 %   Lymphs Abs 1.4 0.7 - 4.0 K/uL   Monocytes Relative 7 %   Monocytes Absolute 0.9 0.1 - 1.0 K/uL   Eosinophils Relative 2 %   Eosinophils Absolute 0.2 0.0 - 0.5 K/uL   Basophils Relative 0 %   Basophils Absolute 0.0 0.0 - 0.1 K/uL   Immature Granulocytes 1 %   Abs Immature Granulocytes 0.10 (H) 0.00 - 0.07 K/uL    Comment: Performed at [facility], [address] [place], [place] [address]    Treatments: antibiotics: Zosyn and surgery: abdominal supracervical hysterectomy + BSO  Discharge Exam: Blood pressure 136/84, pulse (!) 101, temperature 98.9 F (37.2 C), temperature source Oral, resp. rate 16, height 5\' 4"  (1.626 m), weight 96.2 kg, last  menstrual period 03/06/2019, SpO2 97 %. General appearance: alert, cooperative and no distress GI: soft, non-tender; bowel sounds normal; no masses,  no organomegaly Incision/Wound:clean dry intact  Disposition: Discharge disposition: 01-Home or Self Care       Discharge Instructions    Call MD for:  persistant nausea and vomiting   Complete by: As directed    Call MD for:  severe uncontrolled pain   Complete by: As directed    Call MD for:  temperature >100.4   Complete by: As directed    Diet - low sodium heart healthy   Complete by: As directed    Driving Restrictions   Complete by: As directed    No driving for a week   Increase activity slowly   Complete by: As directed    Leave dressing on - Keep it clean, dry, and intact until clinic visit   Complete by: As directed    Lifting restrictions   Complete by: As directed    Do not lift more than 10 pounds for 6 weeks   Sexual Activity Restrictions   Complete by: As directed    No sex for 6 weeks     Allergies as of 03/22/2019      Reactions   Doxycycline Hyclate Itching, Rash       Medication List    STOP taking these medications   megestrol 40 MG tablet Commonly known as: MEGACE   promethazine 25 MG tablet Commonly known as: PHENERGAN     TAKE these medications   amLODipine 10 MG tablet Commonly known as: NORVASC Take 10 mg by mouth daily.   Biotin 10 MG Tabs Take 10 mg by mouth daily.   ciprofloxacin 500 MG tablet Commonly known as: Cipro Take 1 tablet (500 mg total) by mouth 2 (two) times daily.   COLLAGEN PO Take 2 tablets by mouth daily.   ferrous sulfate 325 (65 FE) MG tablet Take 325 mg by mouth daily with breakfast.   ibuprofen 600 MG tablet Commonly known as: ADVIL Take 1 tablet (600 mg total) by mouth every 6 (six) hours as needed.   ketorolac 10 MG tablet Commonly known as: TORADOL Take 1 tablet (10 mg total) by mouth every 8 (eight) hours as needed.   metroNIDAZOLE 500 MG tablet Commonly known as: Flagyl Take 1 tablet (500 mg total) by mouth 2 (two) times daily.   multivitamin with minerals Tabs tablet Take 1 tablet by mouth daily.   ondansetron 8 MG tablet Commonly known as: ZOFRAN Take 1 tablet (8 mg total) by mouth every 6 (six) hours as needed for nausea.   oxyCODONE 5 MG immediate release tablet Commonly known as: Oxy IR/ROXICODONE Take 1-2 tablets (5-10 mg total) by mouth every 4 (four) hours as needed for moderate pain.   pantoprazole 40 MG tablet Commonly known as: PROTONIX Take 40 mg by mouth daily as needed (heartburn).   TURMERIC CURCUMIN PO Take 1 capsule by mouth daily.      Follow-up Information    Florian Buff, MD Follow up on 03/31/2019.   Specialties: Obstetrics and Gynecology, Radiology Why: post op visit Contact information: [place] [address] [phone]           Signed: Florian Buff 03/22/2019, 12:47 PM

## 2019-03-22 NOTE — Discharge Instructions (Signed)
Abdominal Hysterectomy, Care After °This sheet gives you information about how to care for yourself after your procedure. Your health care provider may also give you more specific instructions. If you have problems or questions, contact your health care provider. °What can I expect after the procedure? °After your procedure, it is common to have: °· Pain. °· Fatigue. °· Poor appetite. °· Less interest in sex. °· Vaginal bleeding and discharge. You may need to use a sanitary napkin after this procedure. °Follow these instructions at home: °Bathing °· Do not take baths, swim, or use a hot tub until your health care provider approves. Ask your health care provider if you can take showers. You may only be allowed to take sponge baths for bathing. °· Keep the bandage (dressing) dry until your health care provider says it can be removed. °Incision care ° °· Follow instructions from your health care provider about how to take care of your incision. Make sure you: °? Wash your hands with soap and water before you change your bandage (dressing). If soap and water are not available, use hand sanitizer. °? Change your dressing as told by your health care provider. °? Leave stitches (sutures), skin glue, or adhesive strips in place. These skin closures may need to stay in place for 2 weeks or longer. If adhesive strip edges start to loosen and curl up, you may trim the loose edges. Do not remove adhesive strips completely unless your health care provider tells you to do that. °· Check your incision area every day for signs of infection. Check for: °? Redness, swelling, or pain. °? Fluid or blood. °? Warmth. °? Pus or a bad smell. °Activity °· Do gentle, daily exercises as told by your health care provider. You may be told to take short walks every day and go farther each time. °· Do not lift anything that is heavier than 10 lb (4.5 kg), or the limit that your health care provider tells you, until he or she says that it is  safe. °· Do not drive or use heavy machinery while taking prescription pain medicine. °· Do not drive for 24 hours if you were given a medicine to help you relax (sedative). °· Follow your health care provider's instructions about exercise, driving, and general activities. Ask your health care provider what activities are safe for you. °Lifestyle °· Do not douche, use tampons, or have sex for at least 6 weeks or as told by your health care provider. °· Do not drink alcohol until your health care provider approves. °· Drink enough fluid to keep your urine clear or pale yellow. °· Try to have someone at home with you for the first 1-2 weeks to help. °· Do not use any products that contain nicotine or tobacco, such as cigarettes and e-cigarettes. These can delay healing. If you need help quitting, ask your health care provider. °General instructions °· Take over-the-counter and prescription medicines only as told by your health care provider. °· Do not take aspirin or ibuprofen. These medicines can cause bleeding. °· To prevent or treat constipation while you are taking prescription pain medicine, your health care provider may recommend that you: °? Drink enough fluid to keep your urine clear or pale yellow. °? Take over-the-counter or prescription medicines. °? Eat foods that are high in fiber, such as fresh fruits and vegetables, whole grains, and beans. °? Limit foods that are high in fat and processed sugars, such as fried and sweet foods. °· Keep all   follow-up visits as told by your health care provider. This is important. °Contact a health care provider if: °· You have chills or fever. °· You have redness, swelling, or pain around your incision. °· You have fluid or blood coming from your incision. °· Your incision feels warm to the touch. °· You have pus or a bad smell coming from your incision. °· Your incision breaks open. °· You feel dizzy or light-headed. °· You have pain or bleeding when you urinate. °· You  have persistent diarrhea. °· You have persistent nausea and vomiting. °· You have abnormal vaginal discharge. °· You have a rash. °· You have any type of abnormal reaction or you develop an allergy to your medicine. °· Your pain medicine does not help. °Get help right away if: °· You have a fever and your symptoms suddenly get worse. °· You have severe abdominal pain. °· You have shortness of breath. °· You faint. °· You have pain, swelling, or redness in your leg. °· You have heavy vaginal bleeding with blood clots. °Summary °· After your procedure, it is common to have pain, fatigue and vaginal discharge. °· Do not take baths, swim, or use a hot tub until your health care provider approves. Ask your health care provider if you can take showers. You may only be allowed to take sponge baths for bathing. °· Follow your health care provider's instructions about exercise, driving, and general activities. Ask your health care provider what activities are safe for you. °· Do not lift anything that is heavier than 10 lb (4.5 kg), or the limit that your health care provider tells you, until he or she says that it is safe. °· Try to have someone at home with you for the first 1-2 weeks to help. °This information is not intended to replace advice given to you by your health care provider. Make sure you discuss any questions you have with your health care provider. °Document Released: 12/09/2004 Document Revised: 06/25/2018 Document Reviewed: 05/10/2016 °Elsevier Patient Education © 2020 [employer] ° °

## 2019-03-24 LAB — SURGICAL PATHOLOGY

## 2019-03-25 LAB — AEROBIC/ANAEROBIC CULTURE W GRAM STAIN (SURGICAL/DEEP WOUND)

## 2019-03-31 ENCOUNTER — Other Ambulatory Visit: Payer: Self-pay

## 2019-03-31 ENCOUNTER — Encounter: Payer: Self-pay | Admitting: Obstetrics & Gynecology

## 2019-03-31 ENCOUNTER — Ambulatory Visit (INDEPENDENT_AMBULATORY_CARE_PROVIDER_SITE_OTHER): Payer: 59 | Admitting: Obstetrics & Gynecology

## 2019-03-31 VITALS — BP 120/80 | HR 81 | Ht 64.0 in | Wt 202.0 lb

## 2019-03-31 DIAGNOSIS — Z90711 Acquired absence of uterus with remaining cervical stump: Secondary | ICD-10-CM

## 2019-03-31 MED ORDER — ESTRADIOL 2 MG PO TABS: 2.0000 mg | ORAL_TABLET | Freq: Every day | ORAL | 11 refills | Status: DC

## 2019-03-31 NOTE — Progress Notes (Signed)
  HPI: Patient returns for routine postoperative follow-up having undergone abdominal supracervical hysterectomy with BSO on 03/19/2019.  The patient's immediate postoperative recovery has been unremarkable. Since hospital discharge the patient reports no problems.   Current Outpatient Medications: amLODipine (NORVASC) 10 MG tablet, Take 10 mg by mouth daily. , Disp: , Rfl:  Biotin 10 MG TABS, Take 10 mg by mouth daily., Disp: , Rfl:  ciprofloxacin (CIPRO) 500 MG tablet, Take 1 tablet (500 mg total) by mouth 2 (two) times daily., Disp: 20 tablet, Rfl: 0 COLLAGEN PO, Take 2 tablets by mouth daily., Disp: , Rfl:  ferrous sulfate 325 (65 FE) MG tablet, Take 325 mg by mouth daily with breakfast., Disp: , Rfl:  Multiple Vitamin (MULTIVITAMIN WITH MINERALS) TABS tablet, Take 1 tablet by mouth daily., Disp: , Rfl:  TURMERIC CURCUMIN PO, Take 1 capsule by mouth daily., Disp: , Rfl:  ibuprofen (ADVIL) 600 MG tablet, Take 1 tablet (600 mg total) by mouth every 6 (six) hours as needed. (Patient not taking: Reported on 03/31/2019), Disp: 60 tablet, Rfl: 3 ketorolac (TORADOL) 10 MG tablet, Take 1 tablet (10 mg total) by mouth every 8 (eight) hours as needed. (Patient not taking: Reported on 03/31/2019), Disp: 15 tablet, Rfl: 0 metroNIDAZOLE (FLAGYL) 500 MG tablet, Take 1 tablet (500 mg total) by mouth 2 (two) times daily. (Patient not taking: Reported on 03/31/2019), Disp: 20 tablet, Rfl: 0 ondansetron (ZOFRAN) 8 MG tablet, Take 1 tablet (8 mg total) by mouth every 6 (six) hours as needed for nausea. (Patient not taking: Reported on 03/31/2019), Disp: 20 tablet, Rfl: 0 oxyCODONE (OXY IR/ROXICODONE) 5 MG immediate release tablet, Take 1-2 tablets (5-10 mg total) by mouth every 4 (four) hours as needed for moderate pain. (Patient not taking: Reported on 03/31/2019), Disp: 30 tablet, Rfl: 0 pantoprazole (PROTONIX) 40 MG tablet, Take 40 mg by mouth daily as needed (heartburn)., Disp: , Rfl:   No current  facility-administered medications for this visit.     Blood pressure 120/80, pulse 81, height 5\' 4"  (1.626 m), weight 202 lb (91.6 kg), last menstrual period 03/06/2019.  Physical Exam: Incision clean dry intact  abdominal exam normal  Diagnostic Tests:   Pathology: benign  Impression: S/p abdominal supracervical hysterectomy BSO for pelvic abscess TOA menometrorrhagia anemia  Plan: Meds ordered this encounter  Medications  . estradiol (ESTRACE) 2 MG tablet    Sig: Take 1 tablet (2 mg total) by mouth daily.    Dispense:  30 tablet    Refill:  11     Follow up: 5  weeks  Florian Buff, MD

## 2019-04-14 ENCOUNTER — Encounter: Payer: Self-pay | Admitting: Obstetrics & Gynecology

## 2019-04-14 ENCOUNTER — Telehealth: Payer: Self-pay | Admitting: *Deleted

## 2019-04-14 NOTE — Telephone Encounter (Signed)
Called patient back she states that her return to work date should be on Dec. 1. She needs a new note with that date. Advised patient I would send message to Dr. Elonda Husky and make sure he is ok with that change. Pt aware to check mychart for letter later today.

## 2019-04-14 NOTE — Telephone Encounter (Signed)
Patient left message stating that Dr Elonda Husky has her down to return to work on 11/18. She has concerns about this because she has an appt with him on 11/30. Would like a call back to discuss.

## 2019-05-05 ENCOUNTER — Other Ambulatory Visit: Payer: Self-pay

## 2019-05-05 ENCOUNTER — Encounter: Payer: Self-pay | Admitting: Obstetrics & Gynecology

## 2019-05-05 ENCOUNTER — Ambulatory Visit (INDEPENDENT_AMBULATORY_CARE_PROVIDER_SITE_OTHER): Payer: 59 | Admitting: Obstetrics & Gynecology

## 2019-05-05 VITALS — BP 128/86 | HR 74 | Ht 64.0 in | Wt 203.0 lb

## 2019-05-05 DIAGNOSIS — Z90711 Acquired absence of uterus with remaining cervical stump: Secondary | ICD-10-CM

## 2019-05-05 DIAGNOSIS — Z9889 Other specified postprocedural states: Secondary | ICD-10-CM

## 2019-05-05 NOTE — Progress Notes (Signed)
  HPI: Patient returns for routine postoperative follow-up having undergone abdominal supracervical hysterectomy  on 03/19/2019.  The patient's immediate postoperative recovery has been unremarkable. Since hospital discharge the patient reports doing great no pain for the first time in years.   Current Outpatient Medications: amLODipine (NORVASC) 10 MG tablet, Take 10 mg by mouth daily. , Disp: , Rfl:  Biotin 10 MG TABS, Take 10 mg by mouth daily., Disp: , Rfl:  COLLAGEN PO, Take 2 tablets by mouth daily., Disp: , Rfl:  estradiol (ESTRACE) 2 MG tablet, Take 1 tablet (2 mg total) by mouth daily., Disp: 30 tablet, Rfl: 11 ferrous sulfate 325 (65 FE) MG tablet, Take 325 mg by mouth daily with breakfast., Disp: , Rfl:  Multiple Vitamin (MULTIVITAMIN WITH MINERALS) TABS tablet, Take 1 tablet by mouth daily., Disp: , Rfl:  TURMERIC CURCUMIN PO, Take 1 capsule by mouth daily., Disp: , Rfl:  ciprofloxacin (CIPRO) 500 MG tablet, Take 1 tablet (500 mg total) by mouth 2 (two) times daily. (Patient not taking: Reported on 05/05/2019), Disp: 20 tablet, Rfl: 0 ibuprofen (ADVIL) 600 MG tablet, Take 1 tablet (600 mg total) by mouth every 6 (six) hours as needed. (Patient not taking: Reported on 03/31/2019), Disp: 60 tablet, Rfl: 3 ketorolac (TORADOL) 10 MG tablet, Take 1 tablet (10 mg total) by mouth every 8 (eight) hours as needed. (Patient not taking: Reported on 03/31/2019), Disp: 15 tablet, Rfl: 0 metroNIDAZOLE (FLAGYL) 500 MG tablet, Take 1 tablet (500 mg total) by mouth 2 (two) times daily. (Patient not taking: Reported on 03/31/2019), Disp: 20 tablet, Rfl: 0 ondansetron (ZOFRAN) 8 MG tablet, Take 1 tablet (8 mg total) by mouth every 6 (six) hours as needed for nausea. (Patient not taking: Reported on 03/31/2019), Disp: 20 tablet, Rfl: 0 oxyCODONE (OXY IR/ROXICODONE) 5 MG immediate release tablet, Take 1-2 tablets (5-10 mg total) by mouth every 4 (four) hours as needed for moderate pain. (Patient not  taking: Reported on 03/31/2019), Disp: 30 tablet, Rfl: 0 pantoprazole (PROTONIX) 40 MG tablet, Take 40 mg by mouth daily as needed (heartburn)., Disp: , Rfl:   No current facility-administered medications for this visit.     Blood pressure 128/86, pulse 74, height 5\' 4"  (1.626 m), weight 203 lb (92.1 kg), last menstrual period 03/06/2019.  Physical Exam: Incision clean dry intact  Diagnostic Tests:   Pathology: TOA otherwise benign  Impression: S/p abdominal supracervical hysterectomy for Right sided TOA  Plan:   Follow up: 1  years  Florian Buff, MD

## 2020-02-26 ENCOUNTER — Other Ambulatory Visit: Payer: Self-pay | Admitting: Obstetrics & Gynecology

## 2020-04-02 ENCOUNTER — Other Ambulatory Visit: Payer: Self-pay | Admitting: Obstetrics & Gynecology

## 2021-03-18 ENCOUNTER — Other Ambulatory Visit: Payer: Self-pay | Admitting: Obstetrics & Gynecology

## 2021-04-21 IMAGING — US US PELVIS COMPLETE
1 series · 13 of 25 positions shown · non-contrast
Comparison: CT of the abdomen and pelvis on 02/19/2019

CLINICAL DATA: Abnormal CT today. Abdominal pain started this
morning.

EXAM:
TRANSABDOMINAL AND TRANSVAGINAL ULTRASOUND OF PELVIS
DOPPLER ULTRASOUND OF OVARIES
TECHNIQUE: Both transabdominal and transvaginal ultrasound examinations of the
pelvis were performed. Transabdominal technique was performed for
global imaging of the pelvis including uterus, ovaries, adnexal
regions, and pelvic cul-de-sac.
It was necessary to proceed with endovaginal exam following the
transabdominal exam to visualize the endometrium and adnexal
regions. Color and duplex Doppler ultrasound was utilized to
evaluate blood flow to the ovaries.

[Series 1: us pelvis complete · 145 acquisitions, 13 frames shown]
[im 1/145]
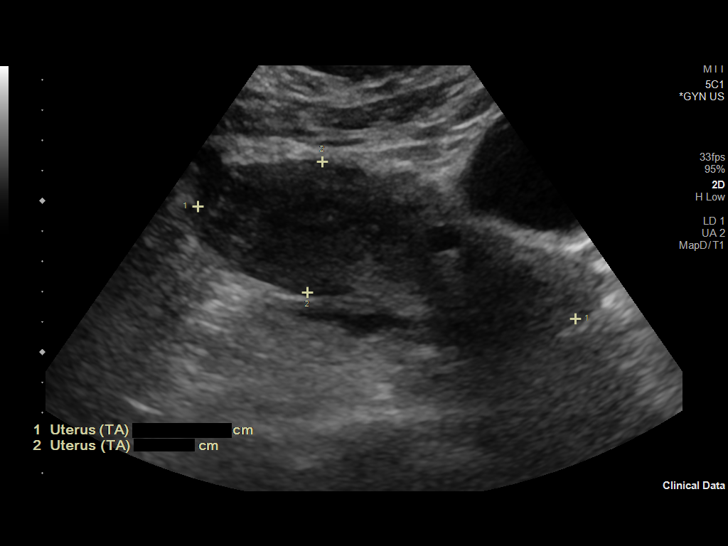
[im 13/145]
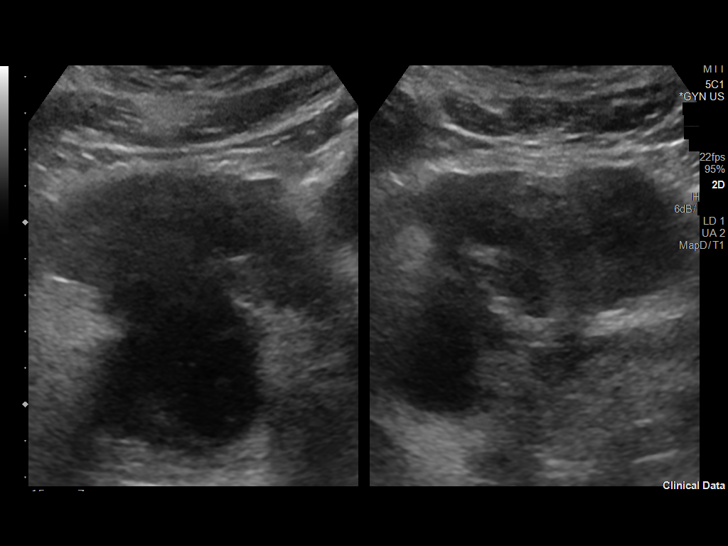
[im 25/145]
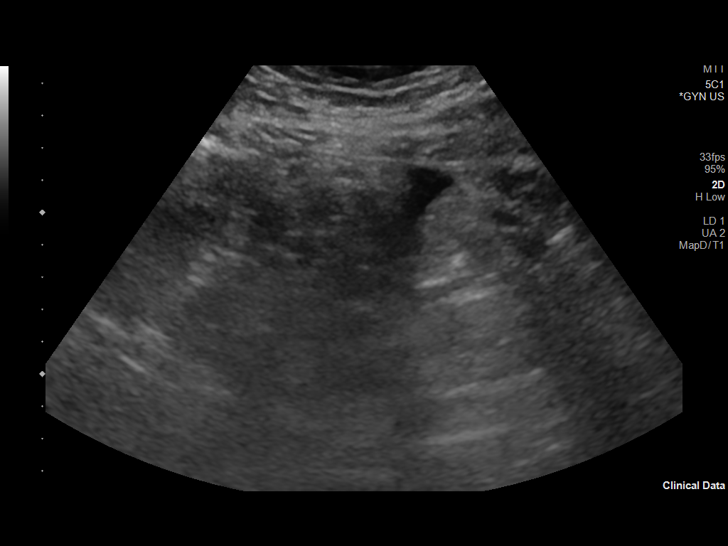
[im 37/145]
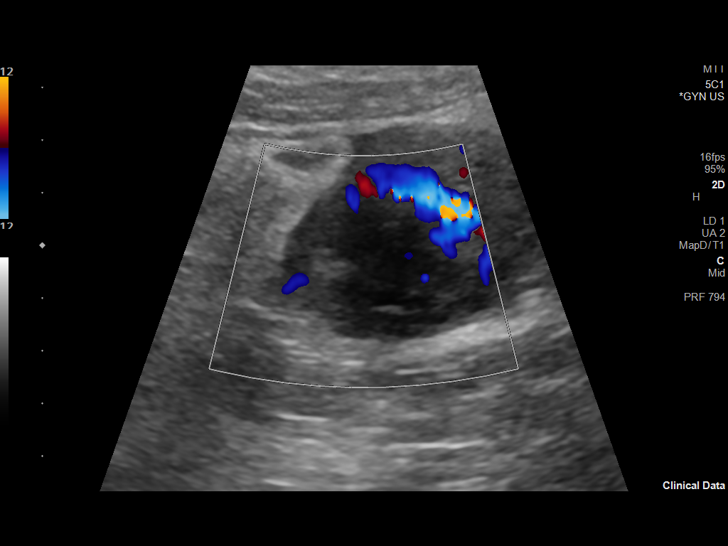
[im 49/145]
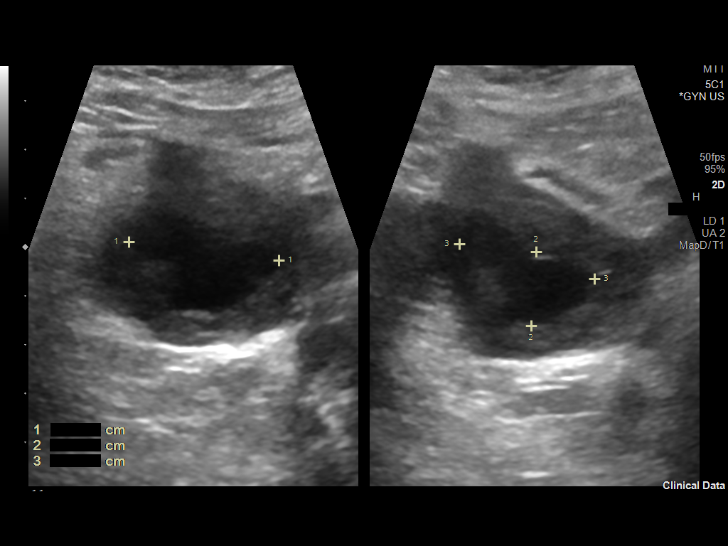
[im 61/145]
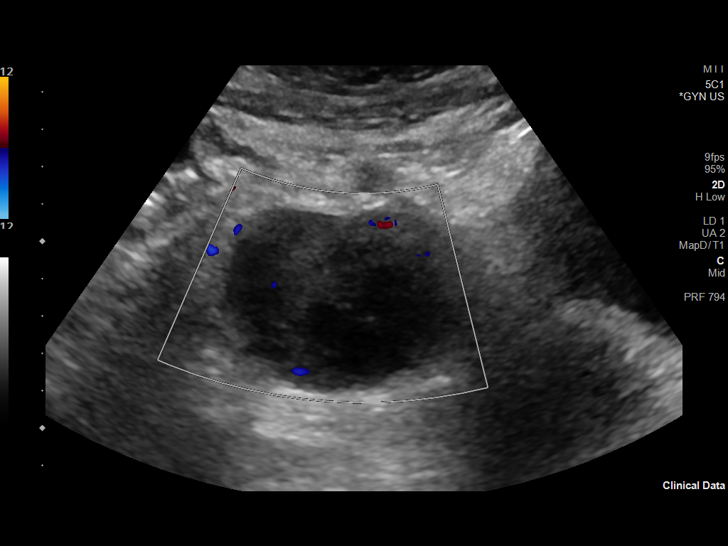
[im 73/145]
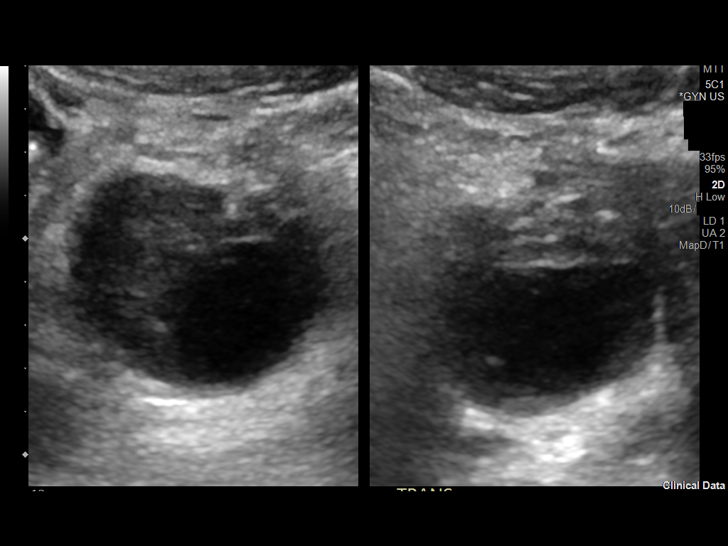
[im 85/145]
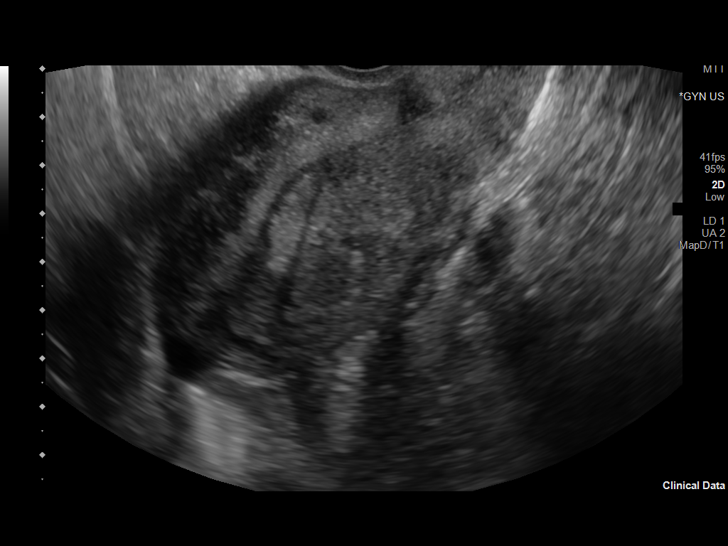
[im 97/145]
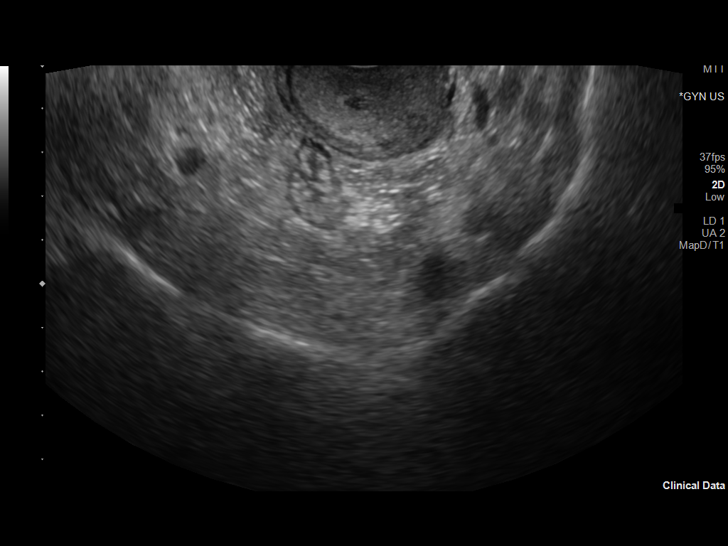
[im 109/145]
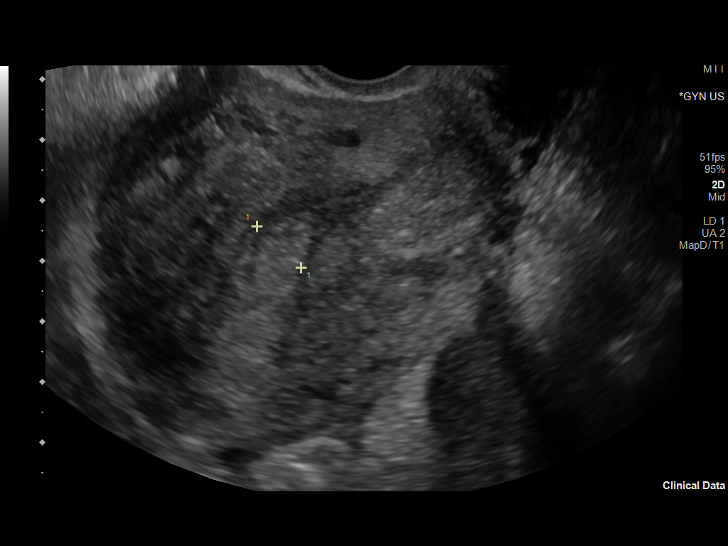
[im 121/145]
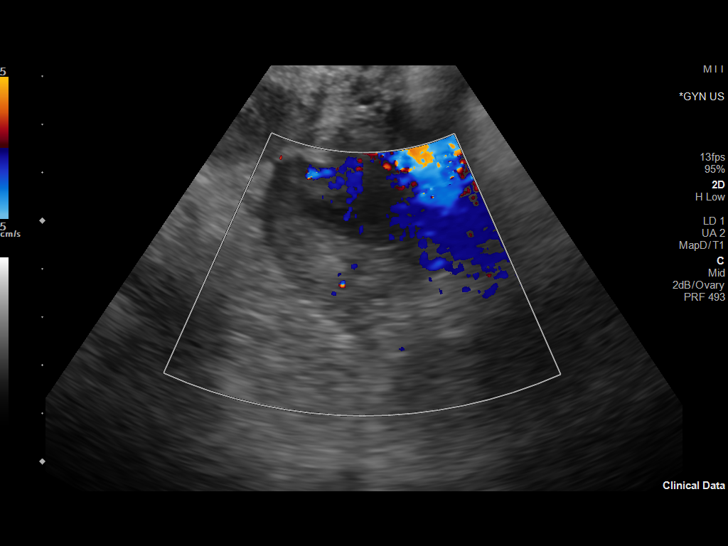
[im 133/145]
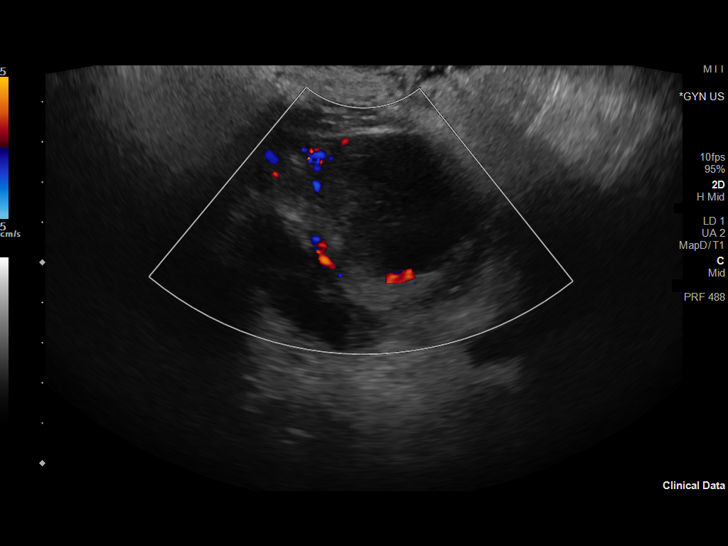
[im 145/145]
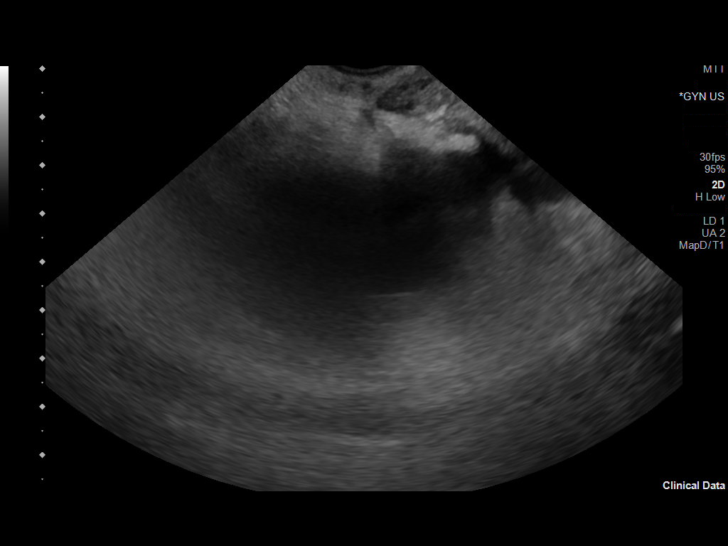

[13 of 25 positions shown; findings below may reference images not displayed]

FINDINGS: Uterus

Measurements: 11.0 x 5.5 x 7.4 centimeters = volume: 233.3 mL.
Multiple fibroids are present. Anterior fibroid is 2.0 x 1.6 x
centimeters. Second fibroid is 0.8 x 0.7 x 0.8 centimeters.

Endometrium

Thickness: 10.0 millimeters.  No focal abnormality visualized.

Right ovary

Measurements: 6.1 x 5.4 x 5.5 centimeters = volume: 96.0 mL. A
complex cystic multi-septated mass in the RIGHT ovary measures 3.9 x
3.4 x 5.0 centimeters. Mass has a thickened wall and irregular
avascular septations.

Left ovary

Measurements: 3.4 x 3.6 x 3.6 centimeters = volume: 23.3 mL. Largest
follicle is 2.7 centimeters.

Pulsed Doppler evaluation of both ovaries demonstrates normal
low-resistance arterial and venous waveforms.

Other findings

Small to moderate amount of free pelvic fluid.
IMPRESSION: 1. Complex cystic mass in the RIGHT ovary with avascular septations
measuring 3.9 centimeters. There is no evidence for ovarian torsion
at this time. The appearance favors hemorrhagic cyst and follow-up
is recommended. Recommend follow-up pelvic ultrasound in 6-12 weeks.
2. Uterine fibroids.
3. Normal appearance of the LEFT ovary.

## 2021-04-21 IMAGING — US US TRANSVAGINAL NON-OB
1 series · 13 of 25 positions shown · non-contrast
Comparison: CT of the abdomen and pelvis on 02/19/2019

CLINICAL DATA: Abnormal CT today. Abdominal pain started this
morning.

EXAM:
TRANSABDOMINAL AND TRANSVAGINAL ULTRASOUND OF PELVIS
DOPPLER ULTRASOUND OF OVARIES
TECHNIQUE: Both transabdominal and transvaginal ultrasound examinations of the
pelvis were performed. Transabdominal technique was performed for
global imaging of the pelvis including uterus, ovaries, adnexal
regions, and pelvic cul-de-sac.
It was necessary to proceed with endovaginal exam following the
transabdominal exam to visualize the endometrium and adnexal
regions. Color and duplex Doppler ultrasound was utilized to
evaluate blood flow to the ovaries.

[Series 1: us transvaginal non-ob · 145 acquisitions, 13 frames shown]
[im 1/145]
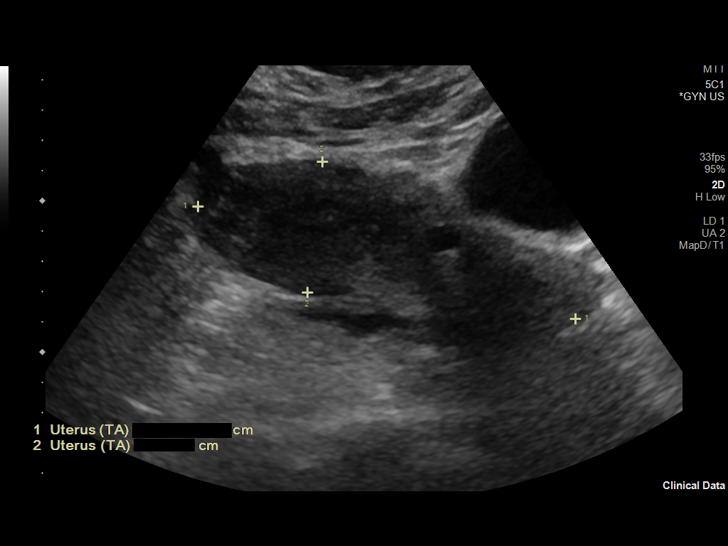
[im 13/145]
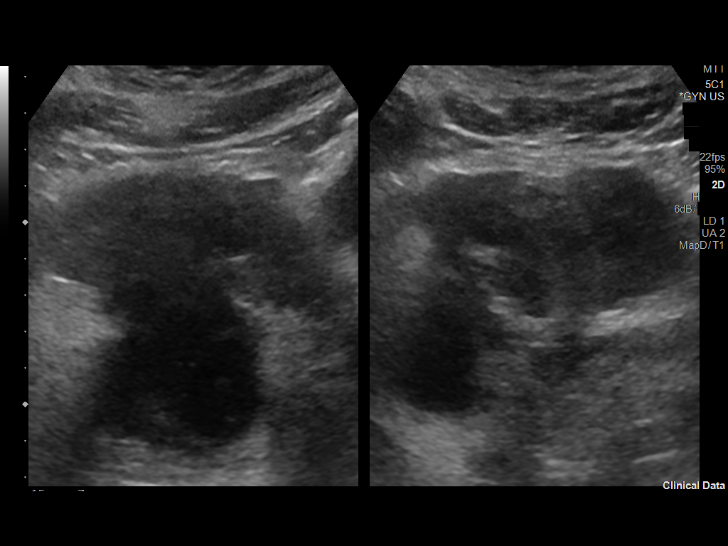
[im 25/145]
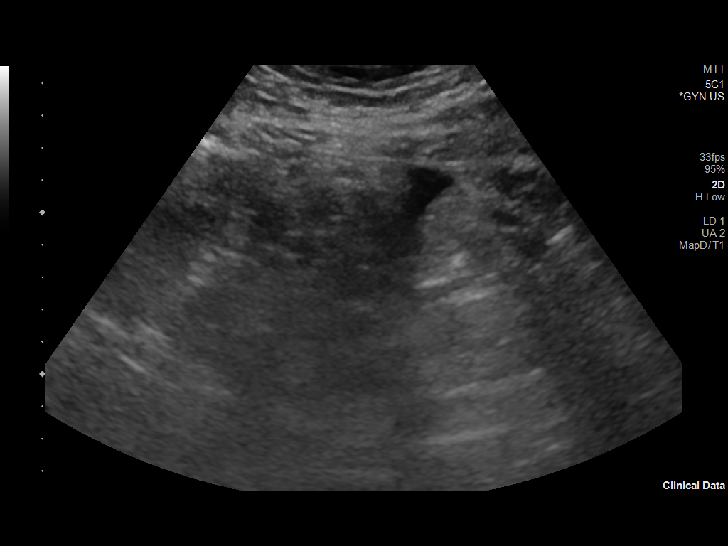
[im 37/145]
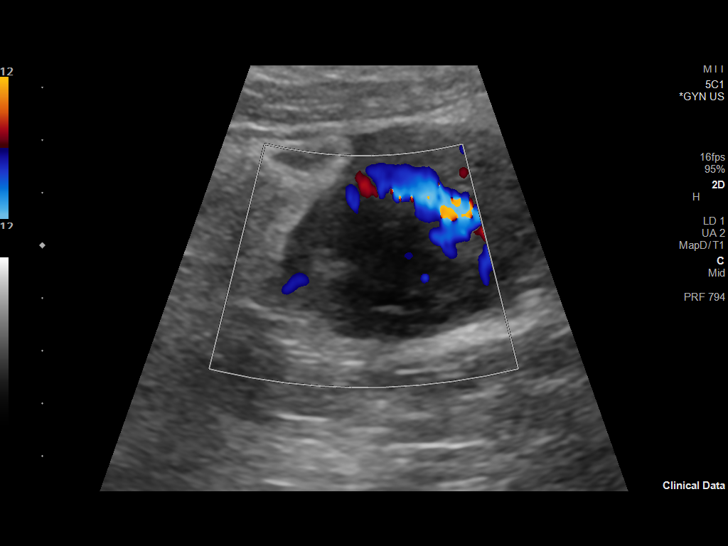
[im 49/145]
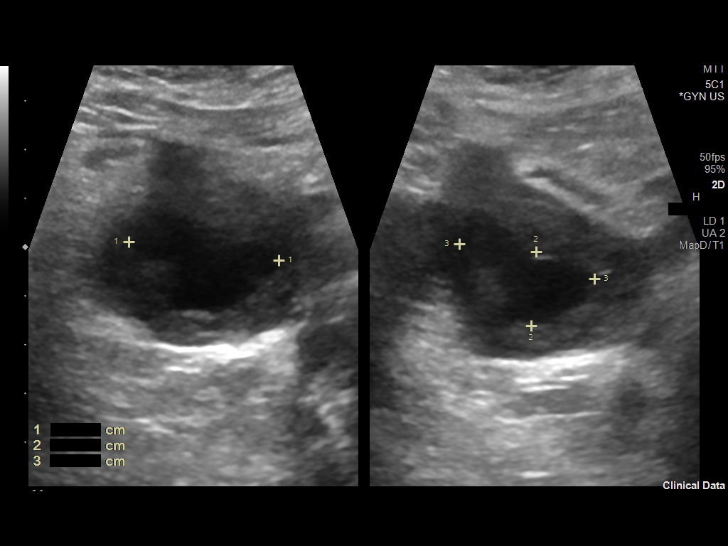
[im 61/145]
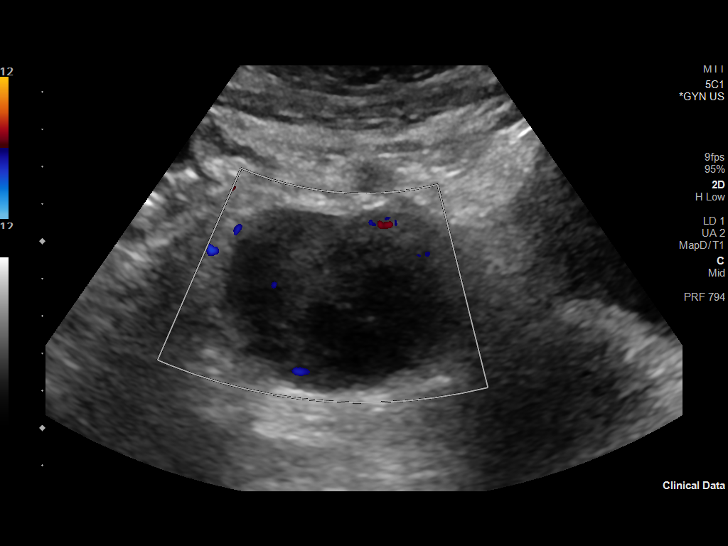
[im 73/145]
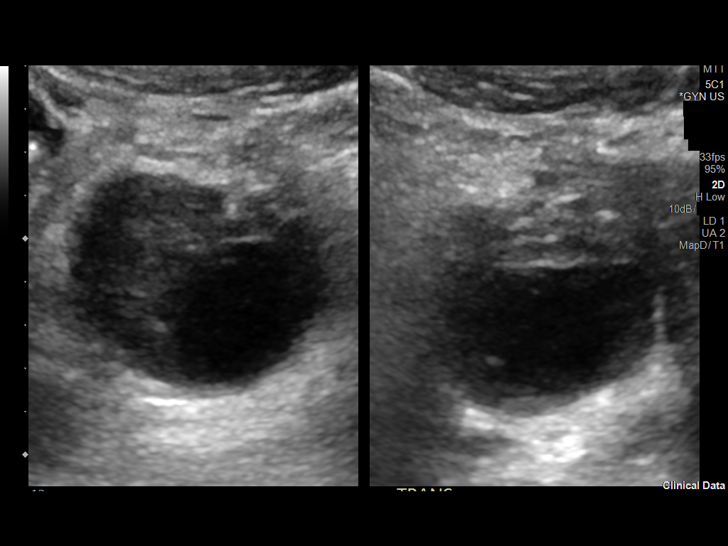
[im 85/145]
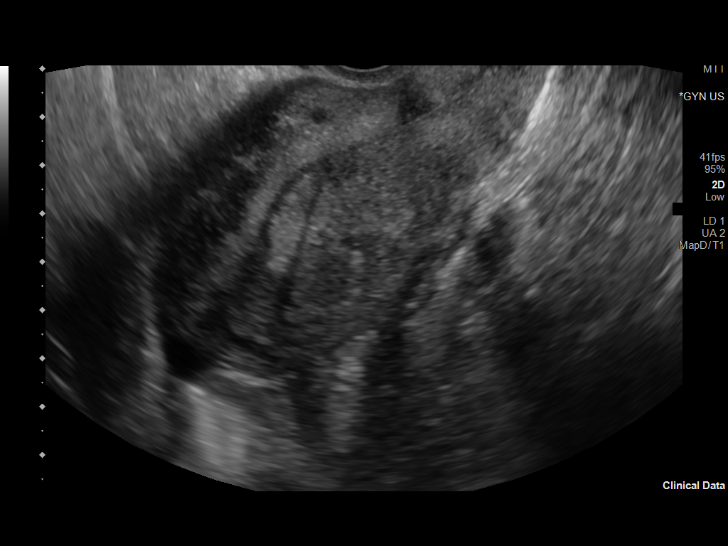
[im 97/145]
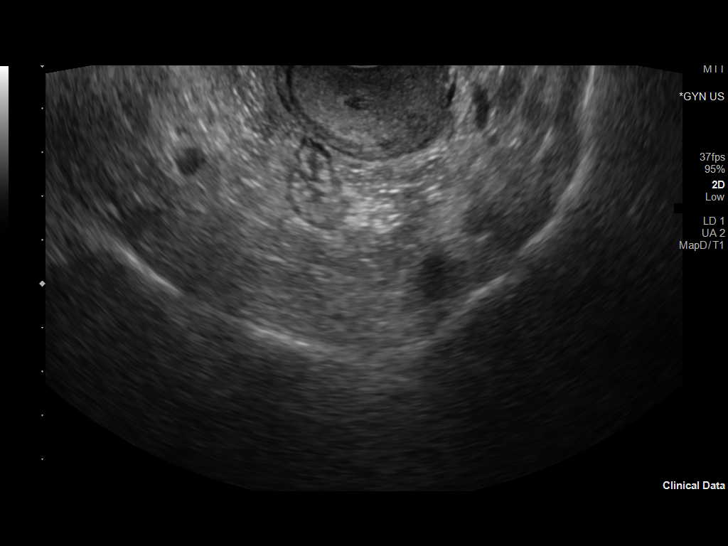
[im 109/145]
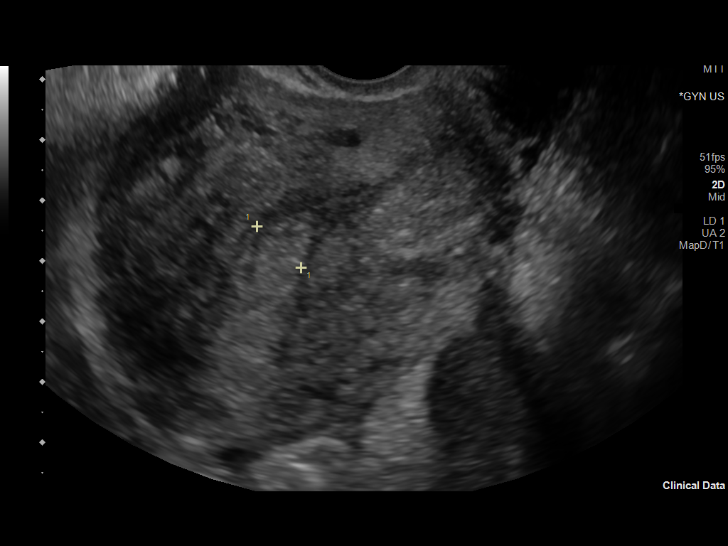
[im 121/145]
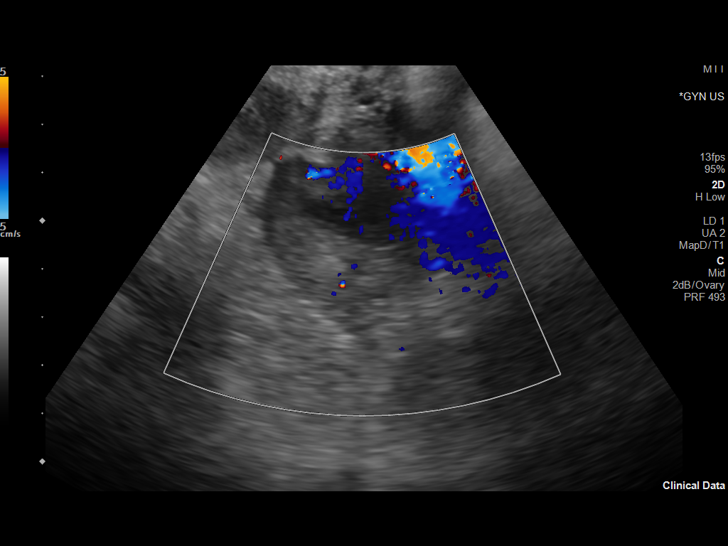
[im 133/145]
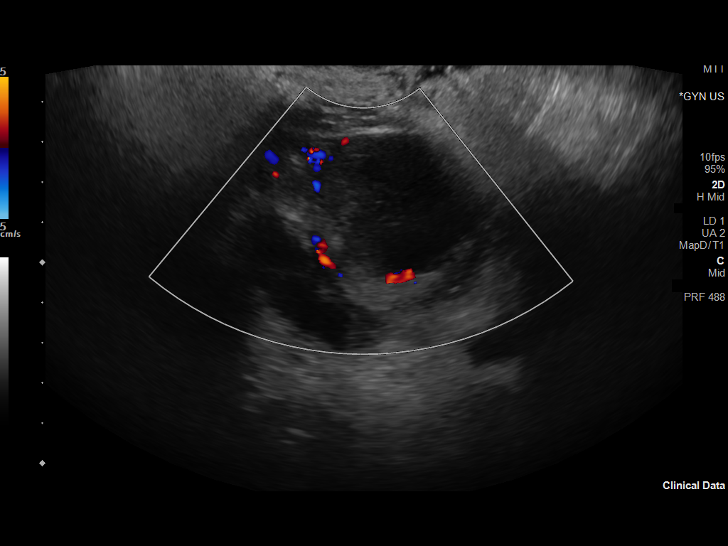
[im 145/145]
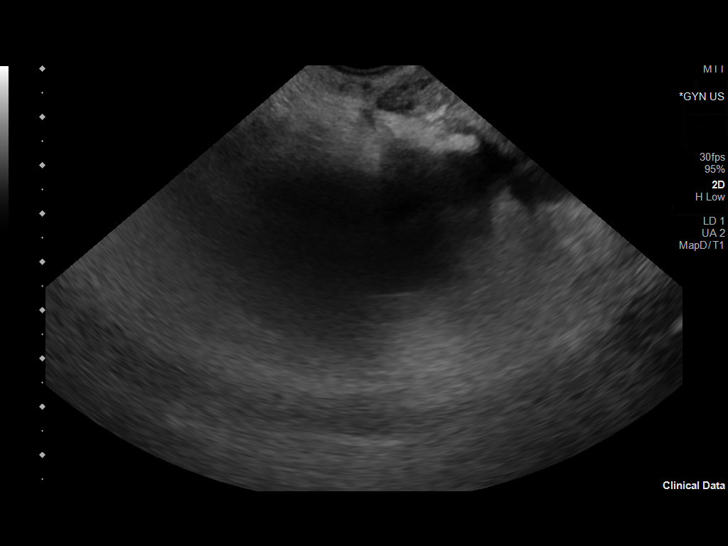

[13 of 25 positions shown; findings below may reference images not displayed]

FINDINGS: Uterus

Measurements: 11.0 x 5.5 x 7.4 centimeters = volume: 233.3 mL.
Multiple fibroids are present. Anterior fibroid is 2.0 x 1.6 x
centimeters. Second fibroid is 0.8 x 0.7 x 0.8 centimeters.

Endometrium

Thickness: 10.0 millimeters.  No focal abnormality visualized.

Right ovary

Measurements: 6.1 x 5.4 x 5.5 centimeters = volume: 96.0 mL. A
complex cystic multi-septated mass in the RIGHT ovary measures 3.9 x
3.4 x 5.0 centimeters. Mass has a thickened wall and irregular
avascular septations.

Left ovary

Measurements: 3.4 x 3.6 x 3.6 centimeters = volume: 23.3 mL. Largest
follicle is 2.7 centimeters.

Pulsed Doppler evaluation of both ovaries demonstrates normal
low-resistance arterial and venous waveforms.

Other findings

Small to moderate amount of free pelvic fluid.
IMPRESSION: 1. Complex cystic mass in the RIGHT ovary with avascular septations
measuring 3.9 centimeters. There is no evidence for ovarian torsion
at this time. The appearance favors hemorrhagic cyst and follow-up
is recommended. Recommend follow-up pelvic ultrasound in 6-12 weeks.
2. Uterine fibroids.
3. Normal appearance of the LEFT ovary.

## 2021-04-21 IMAGING — CT CT ABD-PELV W/ CM
2 of 5 series · 16 of 46 positions shown, 18 images · IV contrast (APPLIED)
Comparison: None.

CLINICAL DATA: Acute lower pelvic pain and cramping left worse than
right radiating across the anterior abdomen beginning this morning.
Decreased appetite 1 week.

EXAM:
CT ABDOMEN AND PELVIS WITH CONTRAST
TECHNIQUE: Multidetector CT imaging of the abdomen and pelvis was performed
using the standard protocol following bolus administration of
intravenous contrast.
CONTRAST:  100mL OMNIPAQUE IOHEXOL 300 MG/ML  SOLN

[Series 2: axial st · axial · 0.95mm/px · z∈[-432,-2]mm · 13 of 98 slices shown, 15 images]
[im 6/98  soft-tissue]
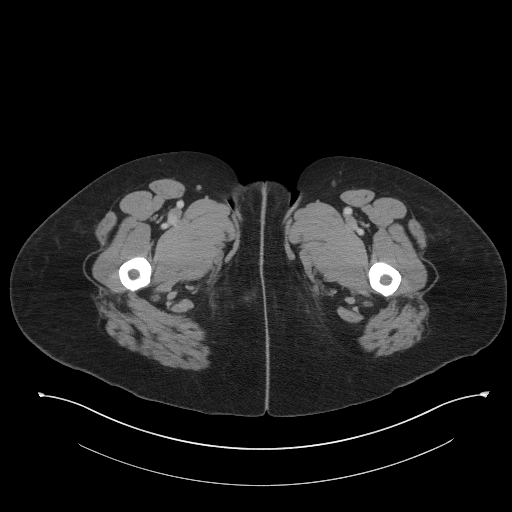
[im 6/98  bone]
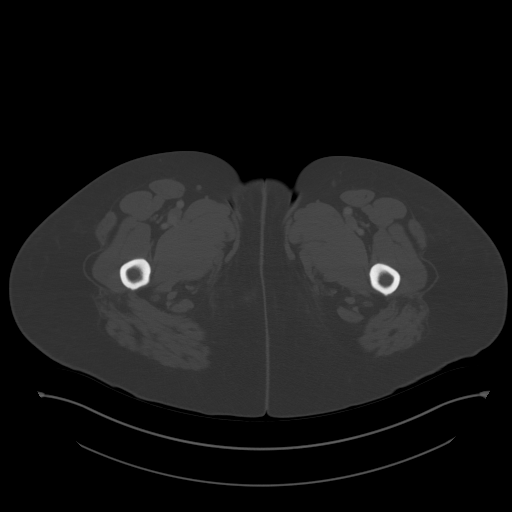
[im 11/98  soft-tissue]
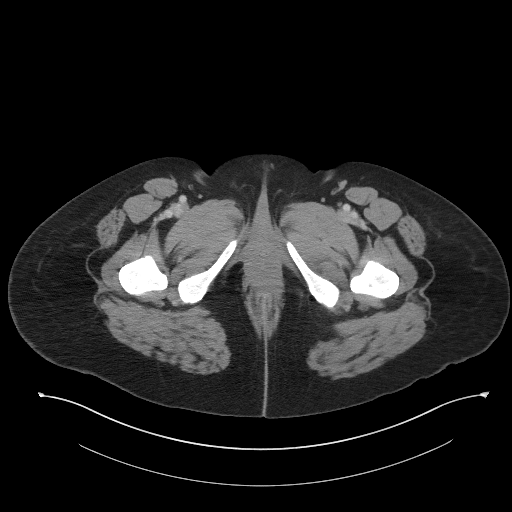
[im 22/98  soft-tissue]
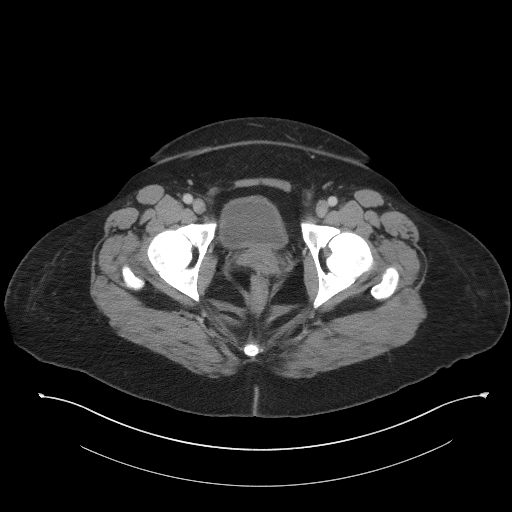
[im 27/98  soft-tissue]
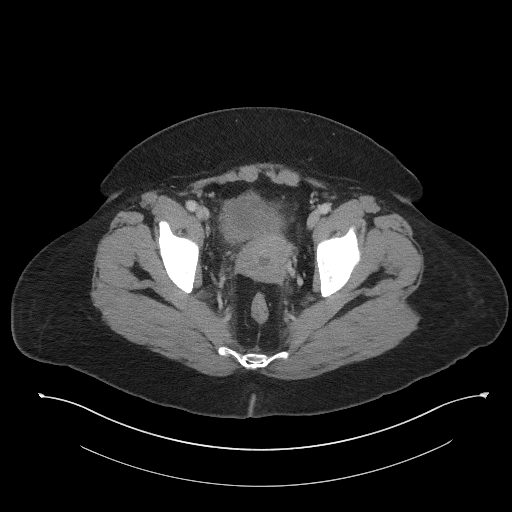
[im 33/98  soft-tissue]
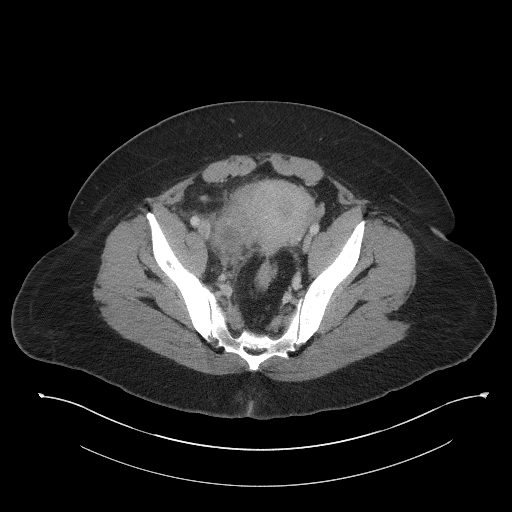
[im 44/98  soft-tissue]
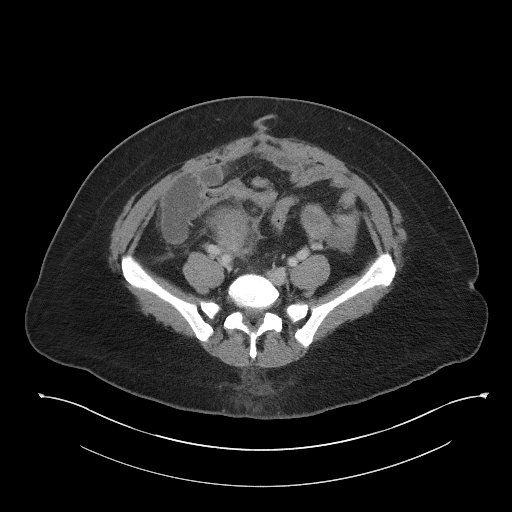
[im 49/98  soft-tissue]
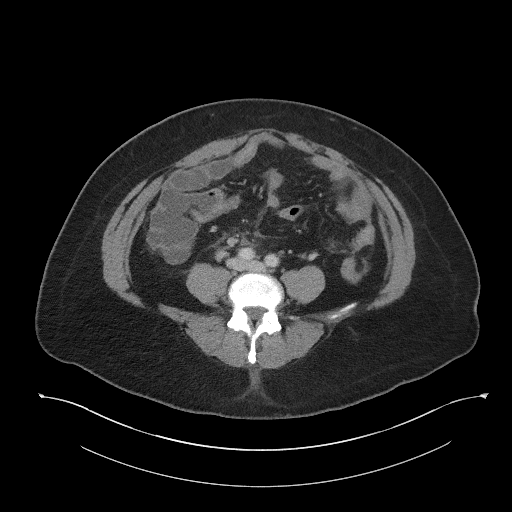
[im 54/98  soft-tissue]
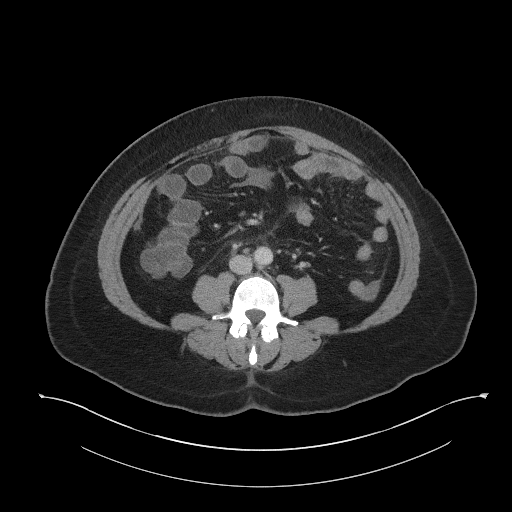
[im 65/98  soft-tissue]
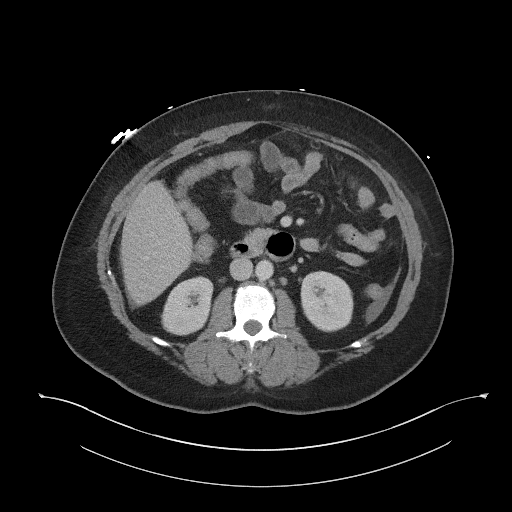
[im 65/98  bone]
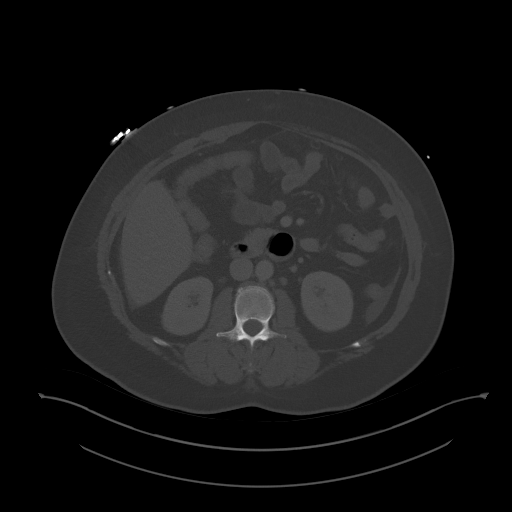
[im 71/98  soft-tissue]
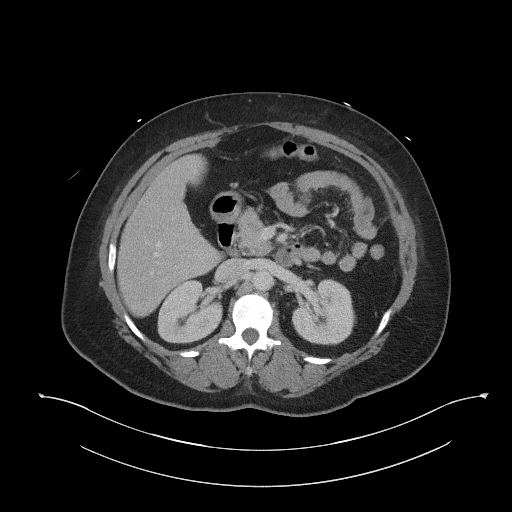
[im 76/98  soft-tissue]
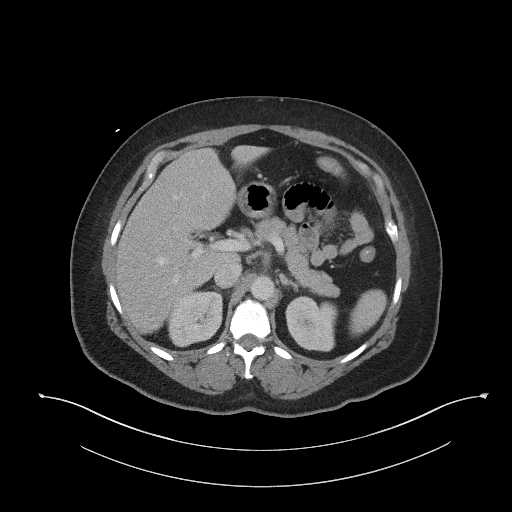
[im 87/98  soft-tissue]
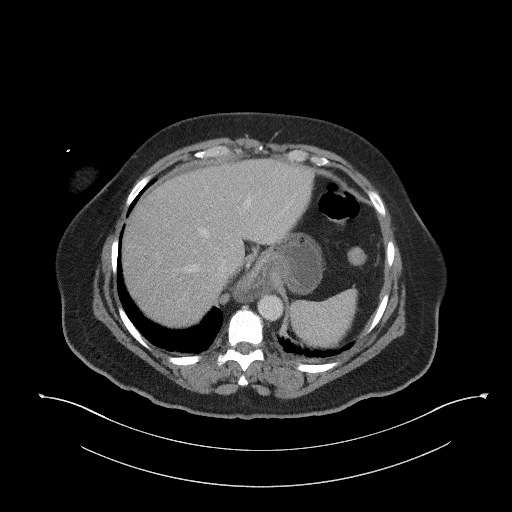
[im 92/98  soft-tissue]
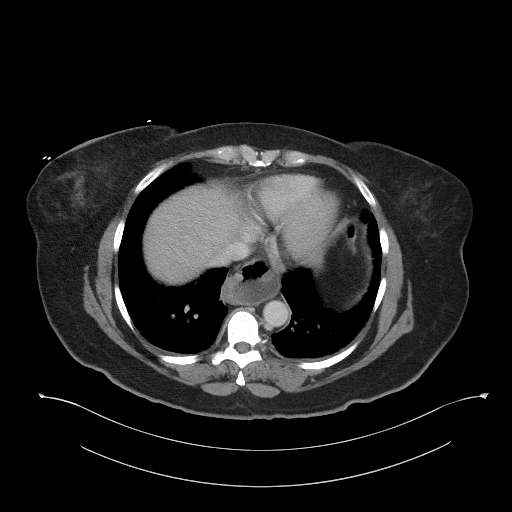

[Series 5: coronal st · coronal · 0.84mm/px · 3 of 108 slices shown]
[im 36/108  soft-tissue]
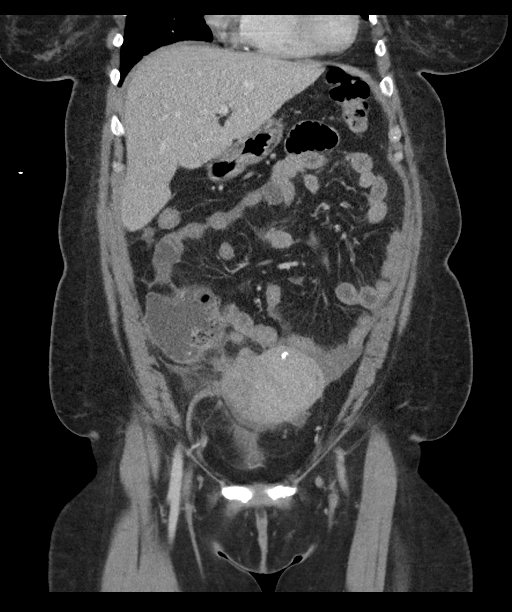
[im 48/108  soft-tissue]
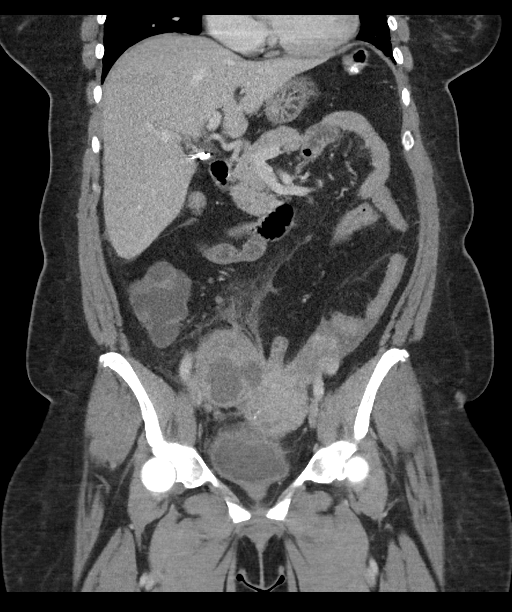
[im 60/108  soft-tissue]
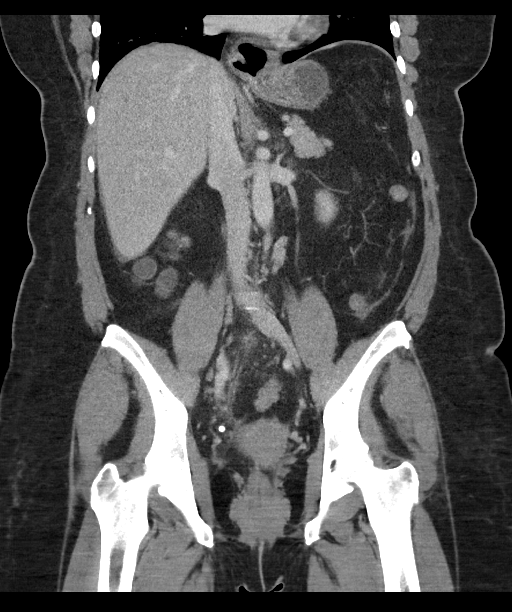

[16 of 46 positions shown; findings below may reference images not displayed]

FINDINGS: Lower chest: Lung bases are within normal. Small to moderate size
hiatal hernia.

Hepatobiliary: Previous cholecystectomy. Liver and biliary tree are
normal.

Pancreas: Normal.

Spleen: Normal.

Adrenals/Urinary Tract: Adrenal glands are normal. Kidneys are
normal in size without hydronephrosis or nephrolithiasis. Ureters
and bladder are normal.

Stomach/Bowel: Small to moderate size hiatal hernia. Small bowel is
within normal. Appendix is within normal. Colon is decompressed from
the appendix flexure distally and otherwise unremarkable.

Vascular/Lymphatic: Normal vasculature. Few small periaortic and
right iliac chain lymph nodes.

Reproductive: Uterus demonstrates a subcentimeter calcification over
the fundus as the uterus and left ovary are otherwise normal. Right
ovary is slightly enlarged measuring 5.2 x 6.6 x 6.8 cm and
demonstrates cystic change there is mild stranding of the fat
adjacent the right ovary and a small amount of free fluid in the
pelvis. These findings could be seen due to torsion versus
PID/tubo-ovarian abscess.

Other: None.

Musculoskeletal: No acute findings.
IMPRESSION: 1. Slightly enlarged right ovary with cystic change and stranding of
the adjacent fat as well as mild amount of adjacent free fluid.
Findings may represent an acute process such as ovarian torsion or
infection such as PID/tubo-ovarian abscess. Recommend pelvic
ultrasound for further evaluation.

2.  Small to moderate size hiatal hernia.

## 2022-03-17 ENCOUNTER — Other Ambulatory Visit: Payer: Self-pay | Admitting: Obstetrics & Gynecology

## 2023-05-02 ENCOUNTER — Other Ambulatory Visit: Payer: Self-pay | Admitting: Obstetrics & Gynecology

## 2024-05-04 ENCOUNTER — Other Ambulatory Visit: Payer: Self-pay | Admitting: Obstetrics & Gynecology
